# Patient Record
Sex: Female | Born: 1985
Health system: Southern US, Community
[De-identification: ages and names within clinical notes are randomized; demographics above are authoritative.]

## PROBLEM LIST (undated history)

## (undated) DIAGNOSIS — J45909 Unspecified asthma, uncomplicated: Secondary | ICD-10-CM

## (undated) DIAGNOSIS — F429 Obsessive-compulsive disorder, unspecified: Secondary | ICD-10-CM

## (undated) HISTORY — DX: Obsessive-compulsive disorder, unspecified: F42.9

---

## 2018-02-01 DIAGNOSIS — J069 Acute upper respiratory infection, unspecified: Secondary | ICD-10-CM | POA: Diagnosis not present

## 2018-02-01 DIAGNOSIS — H6981 Other specified disorders of Eustachian tube, right ear: Secondary | ICD-10-CM | POA: Diagnosis not present

## 2018-02-13 ENCOUNTER — Encounter: Payer: Self-pay | Admitting: Obstetrics and Gynecology

## 2018-02-22 ENCOUNTER — Encounter: Payer: Self-pay | Admitting: Obstetrics and Gynecology

## 2018-02-22 ENCOUNTER — Ambulatory Visit (INDEPENDENT_AMBULATORY_CARE_PROVIDER_SITE_OTHER): Payer: 59 | Admitting: Obstetrics and Gynecology

## 2018-02-22 DIAGNOSIS — Z01419 Encounter for gynecological examination (general) (routine) without abnormal findings: Secondary | ICD-10-CM

## 2018-02-22 DIAGNOSIS — Z124 Encounter for screening for malignant neoplasm of cervix: Secondary | ICD-10-CM

## 2018-02-22 DIAGNOSIS — N979 Female infertility, unspecified: Secondary | ICD-10-CM | POA: Diagnosis not present

## 2018-02-22 NOTE — Progress Notes (Signed)
Gynecology Annual Exam   PCP: Patient, No Pcp Per  Chief Complaint:  Chief Complaint  Patient presents with  . Establish Care  . Infertility    History of Present Illness: Patient is a 32 y.o. G0P0000 presents for annual exam. The patient has no complaints today.   LMP: Patient's last menstrual period was 02/09/2018. Average Interval: regular monthly, every 25 days day Duration of flow:5 days Heavy Menses: no Clots: no Intermenstrual Bleeding: no Postcoital Bleeding: no Dysmenorrhea: no  The patient is sexually active. She currently uses none for contraception. She denies dyspareunia.  The patient does perform self breast exams.  There is no notable family history of breast or ovarian cancer in her family.  The patient wears seatbelts: yes.   The patient has regular exercise: not asked.    The patient denies current symptoms of depression.     Patient is a 32 y.o. G0P0000 presenting for evaluation of infertility. Patient and partner have been attempting unprotected intercourse for 11 years and actively trying for conception for past year.  Pregnancies with current partner   She did not conceive with her prior husband, would now be interested in conceiving with her fiance.    Sexual History Dyspareunia: no Use of Lubricant: no Douching: not applicable  Ovulatory Evaluation LMP: Patient's last menstrual period was 02/09/2018. Menarche:13 Interval regular 25  days Duration of flow: 5 days Heavy Menses: no Clots: no Intermenstrual Bleeding: no Dysmenorrhea: no  Molimina yes Ovulation Predictor Kits Positive  yes  PCOS  Wt Change: no Hirsutism: no Acne: no  Hyperprolactinemia Galactorrhea: no Headaches: no Vision Changes:  no  Thyroid Temperature Intolerance: no Constipation or Diarrhea: no Hair Thinning:  no Palpitation:  no  Prior treatments Meds: Work up at [facility], was noted to be ovulatory but diagnosed with short luteal phase and  given progesterone supplementations.  Work up was 5 years ago Other Therapies: Progesterone  Premature Ovarian Failure Family history of autism, mental retardation, or fragile X: no Family history of premature ovarian failure or early menopause: no Prior radiation or chemotherapy exposoure:  no  Tubal Factor Previous abdominal or pelvic surgery: no Pelvic Pain:  no Endometriosis: no STD: no PID: no Laparoscopy: no Prior HSG: no  Female Factor Sired prior conception:  Yes, has 31 year old from prior relationship Semen analysis: no  Review of Systems: Review of Systems  Constitutional: Negative for chills and fever.  HENT: Negative for congestion.   Respiratory: Negative for cough and shortness of breath.   Cardiovascular: Negative for chest pain and palpitations.  Gastrointestinal: Negative for abdominal pain, constipation, diarrhea, heartburn, nausea and vomiting.  Genitourinary: Negative for dysuria, frequency and urgency.  Skin: Negative for itching and rash.  Neurological: Negative for dizziness and headaches.  Endo/Heme/Allergies: Negative for polydipsia.  Psychiatric/Behavioral: Negative for depression.    Past Medical History:  Past Medical History:  Diagnosis Date  . OCD (obsessive compulsive disorder)     Past Surgical History:  History reviewed. No pertinent surgical history.  Gynecologic History:  Patient's last menstrual period was 02/09/2018. Contraception: none Last Pap: Results were: 1 year ago reportedly normal   Obstetric History: G0P0000  Family History:  History reviewed. No pertinent family history.  Social History:  Social History   Socioeconomic History  . Marital status: Single    Spouse name: Not on file  . Number of children: Not on file  . Years of education: Not on file  . Highest education  level: Not on file  Social Needs  . Financial resource strain: Not on file  . Food insecurity - worry: Not on file  . Food insecurity -  inability: Not on file  . Transportation needs - medical: Not on file  . Transportation needs - non-medical: Not on file  Occupational History  . Not on file  Tobacco Use  . Smoking status: Never Smoker  . Smokeless tobacco: Never Used  Substance and Sexual Activity  . Alcohol use: Yes  . Drug use: No  . Sexual activity: Yes    Partners: Male    Birth control/protection: None  Other Topics Concern  . Not on file  Social History Narrative  . Not on file    Allergies:  Allergies  Allergen Reactions  . Sulfa Antibiotics Itching    Medications: Prior to Admission medications   Medication Sig Start Date End Date Taking? Authorizing Provider  fluvoxaMINE (LUVOX) 100 MG tablet Take 100 mg by mouth at bedtime.   Yes [provider]    Physical Exam Vitals: Blood pressure 108/60, pulse 91, height 5\' 8"  (1.727 m), weight 164 lb (74.4 kg), last menstrual period 02/09/2018.  General: NAD HEENT: normocephalic, anicteric Thyroid: no enlargement, no palpable nodules Pulmonary: No increased work of breathing, CTAB Cardiovascular: RRR, distal pulses 2+ Breast: Breast symmetrical, no tenderness, no palpable nodules or masses, no skin or nipple retraction present, no nipple discharge.  No axillary or supraclavicular lymphadenopathy. Abdomen: NABS, soft, non-tender, non-distended.  Umbilicus without lesions.  No hepatomegaly, splenomegaly or masses palpable. No evidence of hernia  Genitourinary:  External: Normal external female genitalia.  Normal urethral meatus, normal Bartholin's and Skene's glands.    Vagina: Normal vaginal mucosa, no evidence of prolapse.    Cervix: Grossly normal in appearance, no bleeding  Uterus: Non-enlarged, mobile, normal contour.  No CMT  Adnexa: ovaries non-enlarged, no adnexal masses  Rectal: deferred  Lymphatic: no evidence of inguinal lymphadenopathy Extremities: no edema, erythema, or tenderness Neurologic: Grossly intact Psychiatric: mood  appropriate, affect full  Female chaperone present for pelvic and breast  portions of the physical exam    Assessment: 32 y.o. G0P0000 routine annual exam  Plan: Problem List Items Addressed This Visit    None    Visit Diagnoses    Screening for malignant neoplasm of cervix       Relevant Orders   PapIG, HPV, rfx 16/18   Encounter for gynecological examination without abnormal finding       Relevant Orders   PapIG, HPV, rfx 16/18   Female infertility          2) STI screening  was notoffered and therefore not obtained  2)  ASCCP guidelines and rational discussed.  Patient opts for every 3 years screening interval  3) Contraception - the patient is currently using  none.  She is attempting to conceive in the near future  4) Routine healthcare maintenance including cholesterol, diabetes screening discussed managed by PCP  5) Infertility - will obtain records from [facility] to evaluate prior work up.  We can assume viable semen given prior conception by her fiance.  Will plan on HSG with next cycle to determine tubal patency.  We discussed the contentiousness of luteal phase defect in the medical literature, pros and cons of progestin support during the luteal phase of the cycle or in early pregnancy.  5) Return in 1 year (on 02/23/2019) for ANNUAL ([facility]).   Vena Austria, MD, Evern Core  [facility], [facility] 02/24/2018, 8:37 AM

## 2018-02-25 LAB — PAPIG, HPV, RFX 16/18
HPV, HIGH-RISK: NEGATIVE
PAP Smear Comment: 0

## 2018-03-05 ENCOUNTER — Telehealth: Payer: Self-pay

## 2018-03-05 ENCOUNTER — Other Ambulatory Visit: Payer: Self-pay | Admitting: Obstetrics and Gynecology

## 2018-03-05 DIAGNOSIS — N979 Female infertility, unspecified: Secondary | ICD-10-CM

## 2018-03-05 NOTE — Telephone Encounter (Signed)
Please advise 

## 2018-03-05 NOTE — Progress Notes (Signed)
LMP 03/05/18 schedule HSG for 4/4/1/8 day 10

## 2018-03-05 NOTE — Telephone Encounter (Signed)
Patient is aware of HSG on 03/15/18 @ 1:30pm. Patient is aware to arrive at the [facility] registration desk by 1:15pm. Directions given.

## 2018-03-05 NOTE — Telephone Encounter (Signed)
Pt states today is 1st day of her period.  AMS to schedule HSG test for her.  She will be out of town April 11-17th.  [phone]

## 2018-03-05 NOTE — Telephone Encounter (Signed)
-----   Message from Vena AustriaAndreas Staebler, MD sent at 03/05/2018  1:30 PM EDT ----- Regarding: HSG Needs HSG on 03/15/18

## 2018-03-06 ENCOUNTER — Telehealth: Payer: Self-pay

## 2018-03-06 NOTE — Telephone Encounter (Signed)
Pt states she is on her cycle and has developed a yeast infection and does not feel like OTC treatment would work well. Pt is requesting rx for diflucan sent to [facility]. Please advise. Thank you.  Cb# [phone].

## 2018-03-07 NOTE — Telephone Encounter (Signed)
Patient is calling to follow up on her medication request please advise

## 2018-03-08 ENCOUNTER — Other Ambulatory Visit: Payer: Self-pay | Admitting: Obstetrics and Gynecology

## 2018-03-08 MED ORDER — FLUCONAZOLE 150 MG PO TABS: 150.0000 mg | ORAL_TABLET | Freq: Once | ORAL | 0 refills | Status: AC

## 2018-03-08 NOTE — Telephone Encounter (Signed)
Rx called in tell her I was out of the office so just go the message today

## 2018-03-08 NOTE — Telephone Encounter (Signed)
Patient aware of prescription at pharmacy

## 2018-03-15 ENCOUNTER — Ambulatory Visit
Admission: RE | Admit: 2018-03-15 | Discharge: 2018-03-15 | Disposition: A | Discharge status: Home / Self Care | Payer: 59 | Source: Ambulatory Visit | Attending: Obstetrics and Gynecology | Admitting: Obstetrics and Gynecology

## 2018-03-15 DIAGNOSIS — N979 Female infertility, unspecified: Secondary | ICD-10-CM | POA: Insufficient documentation

## 2018-03-15 MED ORDER — IOPAMIDOL (ISOVUE-370) INJECTION 76%
50.0000 mL | Freq: Once | INTRAVENOUS | Status: AC | PRN
  Administered 2018-03-15: 50 mL

## 2018-03-15 NOTE — Procedures (Signed)
Consents signed and reviewed with patient.  Indication for procedure confirmed, Patient's last menstrual period was 03/05/2018 (exact date). currently cycle day 10.  No allergies to contrast or iodine was confirmed.  The catheter was primed with [id number] to eliminate air bubbles.  A sterile speculum was placed in the patient's vagina to allow visualization of the cervix.  The cervix was prepped with betadine solution.   The HSG was advanced through the external cervical os into uterine cavity, the catheter balloon was then inflated. Approximately 50mL dye injected under fluoroscopic, there was a poor cervical seal with reflux of dye vaginally.  Fallopian tube on the right showed free spill, the fallopian tube on the left partially filled but did not spill.  There was no evidence of hydrosalpinx and lack of spill is likely attributed to poor seal and or tubal spasm.  The catheter was placed on additional traction but this resulted in expulsion of the catheter.   Patient tolerated procedure well.   Vena AustriaAndreas Brenya Taulbee, MD, Evern CoreFACOG [facility], [facility] 03/15/2018, 2:36 PM

## 2018-03-28 ENCOUNTER — Other Ambulatory Visit: Payer: Self-pay | Admitting: Obstetrics and Gynecology

## 2018-03-28 MED ORDER — LETROZOLE 2.5 MG PO TABS: 2.5000 mg | ORAL_TABLET | Freq: Every day | ORAL | 0 refills | Status: DC

## 2018-04-12 ENCOUNTER — Ambulatory Visit (INDEPENDENT_AMBULATORY_CARE_PROVIDER_SITE_OTHER): Payer: 59 | Admitting: Physician Assistant

## 2018-04-12 ENCOUNTER — Encounter: Payer: Self-pay | Admitting: Physician Assistant

## 2018-04-12 DIAGNOSIS — F429 Obsessive-compulsive disorder, unspecified: Secondary | ICD-10-CM

## 2018-04-12 DIAGNOSIS — F419 Anxiety disorder, unspecified: Secondary | ICD-10-CM | POA: Diagnosis not present

## 2018-04-12 DIAGNOSIS — J309 Allergic rhinitis, unspecified: Secondary | ICD-10-CM | POA: Diagnosis not present

## 2018-04-12 MED ORDER — FLUVOXAMINE MALEATE 100 MG PO TABS: 100.0000 mg | ORAL_TABLET | Freq: Every day | ORAL | 0 refills | Status: DC

## 2018-04-12 NOTE — Progress Notes (Signed)
Patient: Joy Garcia Female    DOB: Jan 03, 1986   32 y.o.   MRN: [id number] Visit Date: 04/12/2018  Today's Provider: [name], PA-C   Chief Complaint  Patient presents with  . New Patient (Initial Visit)   Subjective:    HPI Patient comes in today to establish care into the practice. She moved in December from [place], [place] and is living in [place], [place] with her significant other, [name]. He has a two year old daughter from previous relationship. She feels well today. She works as a Patent examiner for [employer].   She reports she used to weigh 210 lbs and lost weigh via weight lifting and boot camp. She had labs recently drawn by previous PCP and says they were normal.  She has a history of OCD, currently table on 100 mg daily fluvoxamine. She reports long history of multiple other antidepressants/anxiety. She was seeing counselor previously, no longer, feels she does not need it at this time. She is currently taking Clonazepam 0.5mg  daily as needed for her anxiety. She reports that is varies on how often she has to take it. Reports it is rarely, for instances such as plane rides.  She is currently seeing Dr. Bonney Aid at [facility] for fertility treatment, unknown cause of fertility. She does not have any children. Sexually active with single female partner. PAP 02/2018 normal and HPV negative.      Allergies  Allergen Reactions  . Wellbutrin [Bupropion] Anaphylaxis  . Amoxicillin Itching  . Bactrim [Sulfamethoxazole-Trimethoprim]   . Sulfa Antibiotics Itching     Current Outpatient Medications:  .  clonazePAM (KLONOPIN) 0.5 MG tablet, Take 0.5 mg by mouth as needed for anxiety., Disp: , Rfl:  .  fluvoxaMINE (LUVOX) 100 MG tablet, Take 100 mg by mouth at bedtime., Disp: , Rfl:   Review of Systems  Constitutional: Negative.   HENT: Negative.   Eyes: Negative.   Respiratory: Negative.   Cardiovascular: Negative.   Gastrointestinal: Negative.     Endocrine: Negative.   Genitourinary: Negative.   Musculoskeletal: Negative.   Skin: Negative.   Allergic/Immunologic: Positive for environmental allergies.  Neurological: Negative.   Hematological: Negative.   Psychiatric/Behavioral: Negative.    No past surgical history on file.  Family History  Problem Relation Age of Onset  . Hyperlipidemia Mother   . Hypertension Mother   . Depression Mother   . Cancer Father        skin cancer  . Atrial fibrillation Father   . Hyperlipidemia Father   . Hypertension Father   . Heart disease Father   . Stroke Father     Social History   Tobacco Use  . Smoking status: Never Smoker  . Smokeless tobacco: Never Used  Substance Use Topics  . Alcohol use: Yes   Objective:   BP 118/72 (BP Location: Left Arm, Patient Position: Sitting, Cuff Size: Normal)   Pulse 74   Temp 98.9 F (37.2 C)   Resp 16   Ht  (1.727 m)   Wt 160 lb (72.6 kg)   SpO2 98%   BMI 24.33 kg/m  Vitals:   04/12/18 1406  BP: 118/72  Pulse: 74  Resp: 16  Temp: 98.9 F (37.2 C)  SpO2: 98%  Weight: 160 lb (72.6 kg)  Height:  (1.727 m)     Physical Exam  Constitutional: She is oriented to person, place, and time. She appears well-developed and well-nourished.  HENT:  Head:  Normocephalic and atraumatic.  Right Ear: External ear normal.  Left Ear: External ear normal.  Mouth/Throat: Oropharynx is clear and moist.  Eyes: Conjunctivae are normal. Right eye exhibits no discharge. Left eye exhibits no discharge.  Neck: Neck supple.  Cardiovascular: Normal rate and regular rhythm.  Pulmonary/Chest: Effort normal and breath sounds normal.  Lymphadenopathy:    She has no cervical adenopathy.  Neurological: She is alert and oriented to person, place, and time.  Skin: Skin is warm and dry.  Psychiatric: She has a normal mood and affect. Her behavior is normal.        Assessment & Plan:     1. Anxiety  Refilled, stable on current  medication.  - fluvoxaMINE (LUVOX) 100 MG tablet; Take 1 tablet (100 mg total) by mouth at bedtime.  Dispense: 90 tablet; Refill: 0  2. Allergic rhinitis, unspecified seasonality, unspecified trigger   3. Obsessive-compulsive disorder, unspecified type  - fluvoxaMINE (LUVOX) 100 MG tablet; Take 1 tablet (100 mg total) by mouth at bedtime.  Dispense: 90 tablet; Refill: 0  The entirety of the information documented in the History of Present Illness, Review of Systems and Physical Exam were personally obtained by me. Portions of this information were initially documented by Anson Oregon, CMA and reviewed by me for thoroughness and accuracy.   Return in about 1 year (around 04/13/2019) for CPE/Follow up .        [name], PA-C  [facility]

## 2018-04-12 NOTE — Patient Instructions (Signed)

## 2018-04-19 ENCOUNTER — Telehealth: Payer: Self-pay | Admitting: Obstetrics and Gynecology

## 2018-04-19 ENCOUNTER — Other Ambulatory Visit: Payer: Self-pay | Admitting: Obstetrics and Gynecology

## 2018-04-19 ENCOUNTER — Other Ambulatory Visit: Payer: 59

## 2018-04-19 DIAGNOSIS — N979 Female infertility, unspecified: Secondary | ICD-10-CM

## 2018-04-19 NOTE — Telephone Encounter (Signed)
Patient is schedule 04/19/18 for labs

## 2018-04-19 NOTE — Telephone Encounter (Signed)
-----   Message from Vena Austria, MD sent at 04/19/2018  7:28 AM EDT ----- Regarding: Progesterone lab Needs progesterone today 04/19/18

## 2018-04-20 ENCOUNTER — Encounter (INDEPENDENT_AMBULATORY_CARE_PROVIDER_SITE_OTHER): Payer: Self-pay

## 2018-04-20 LAB — PROGESTERONE: Progesterone: 11.8 ng/mL

## 2018-04-26 ENCOUNTER — Other Ambulatory Visit: Payer: Self-pay | Admitting: Obstetrics and Gynecology

## 2018-04-26 MED ORDER — LETROZOLE 2.5 MG PO TABS: 2.5000 mg | ORAL_TABLET | Freq: Every day | ORAL | 0 refills | Status: AC

## 2018-04-26 NOTE — Progress Notes (Signed)
LMP 04/25/2018 Cycle II letrozole 2.5mg 

## 2018-05-18 ENCOUNTER — Other Ambulatory Visit: Payer: Self-pay | Admitting: Obstetrics and Gynecology

## 2018-05-18 MED ORDER — LETROZOLE 2.5 MG PO TABS: 2.5000 mg | ORAL_TABLET | Freq: Every day | ORAL | 0 refills | Status: AC

## 2018-05-18 NOTE — Progress Notes (Signed)
LMP 05/17/2018 Cycle III letrozole 2.5mg 

## 2018-07-05 ENCOUNTER — Other Ambulatory Visit: Payer: Self-pay | Admitting: Obstetrics and Gynecology

## 2018-07-05 MED ORDER — LETROZOLE 2.5 MG PO TABS: 2.5000 mg | ORAL_TABLET | Freq: Every day | ORAL | 0 refills | Status: AC

## 2018-07-05 NOTE — Progress Notes (Signed)
LMP 07/04/2018 Cycle IV letrozole 2.5mg 

## 2018-08-24 ENCOUNTER — Other Ambulatory Visit: Payer: Self-pay

## 2018-08-24 DIAGNOSIS — F419 Anxiety disorder, unspecified: Secondary | ICD-10-CM

## 2018-08-24 DIAGNOSIS — F429 Obsessive-compulsive disorder, unspecified: Secondary | ICD-10-CM

## 2018-08-24 MED ORDER — FLUVOXAMINE MALEATE 100 MG PO TABS: 100.0000 mg | ORAL_TABLET | Freq: Every day | ORAL | 1 refills | Status: DC

## 2019-01-24 ENCOUNTER — Ambulatory Visit: Payer: 59 | Admitting: Physician Assistant

## 2019-01-24 ENCOUNTER — Encounter: Payer: Self-pay | Admitting: Physician Assistant

## 2019-01-24 VITALS — BP 119/82 | HR 67 | Temp 98.7°F | Wt 185.4 lb

## 2019-01-24 DIAGNOSIS — B356 Tinea cruris: Secondary | ICD-10-CM | POA: Diagnosis not present

## 2019-01-24 DIAGNOSIS — L299 Pruritus, unspecified: Secondary | ICD-10-CM | POA: Diagnosis not present

## 2019-01-24 MED ORDER — HYDROXYZINE HCL 10 MG PO TABS: 10.0000 mg | ORAL_TABLET | Freq: Every evening | ORAL | 0 refills | Status: DC | PRN

## 2019-01-24 NOTE — Progress Notes (Signed)
       Patient: Joy Garcia Female    DOB: 1986/05/11   33 y.o.   MRN: [id number] Visit Date: 01/29/2019  Today's Provider: Trey SailorsAdriana M Pollak, PA-C   Chief Complaint  Patient presents with  . Rash   Subjective:    Rash  The current episode started 1 to 4 weeks ago. The problem is unchanged. The affected locations include the groin. The rash is characterized by itchiness. She was exposed to nothing. Past treatments include anti-itch cream. The treatment provided no relief.   She has been using lamisil cream OTC. She reports there was a bump in the area.    Allergies  Allergen Reactions  . Wellbutrin [Bupropion] Anaphylaxis  . Amoxicillin Itching  . Bactrim [Sulfamethoxazole-Trimethoprim]   . Sulfa Antibiotics Itching     Current Outpatient Medications:  .  clonazePAM (KLONOPIN) 0.5 MG tablet, Take 0.5 mg by mouth as needed for anxiety., Disp: , Rfl:  .  fluvoxaMINE (LUVOX) 100 MG tablet, Take 1 tablet (100 mg total) by mouth at bedtime., Disp: 90 tablet, Rfl: 1 .  hydrOXYzine (ATARAX/VISTARIL) 10 MG tablet, Take 1 tablet (10 mg total) by mouth at bedtime as needed., Disp: 30 tablet, Rfl: 0 .  Prenatal Vit-Fe Fumarate-FA (MULTIVITAMIN-PRENATAL) 27-0.8 MG TABS tablet, Take 1 tablet by mouth daily at 12 noon., Disp: , Rfl:   Review of Systems  HENT: Negative.   Respiratory: Negative.   Genitourinary: Negative.   Skin: Positive for rash.  Neurological: Negative.     Social History   Tobacco Use  . Smoking status: Never Smoker  . Smokeless tobacco: Never Used  Substance Use Topics  . Alcohol use: Yes      Objective:   BP 119/82 (BP Location: Left Arm, Patient Position: Sitting, Cuff Size: Normal)   Pulse 67   Temp 98.7 F (37.1 C) (Oral)   Wt 185 lb 6.4 oz (84.1 kg)   LMP 01/21/2019   SpO2 99%   BMI 28.19 kg/m  Vitals:   01/24/19 1205  BP: 119/82  Pulse: 67  Temp: 98.7 F (37.1 C)  TempSrc: Oral  SpO2: 99%  Weight: 185 lb 6.4 oz (84.1 kg)      Physical Exam Constitutional:      Appearance: Normal appearance.  Genitourinary:    Pubic Area: Rash present.    Skin:    General: Skin is warm and dry.  Neurological:     Mental Status: She is alert and oriented to person, place, and time. Mental status is at baseline.         Assessment & Plan    1. Tinea cruris  Hydroxyzine for itching. Continue lamisil for one week after disappearance of lesion.  - hydrOXYzine (ATARAX/VISTARIL) 10 MG tablet; Take 1 tablet (10 mg total) by mouth at bedtime as needed.  Dispense: 30 tablet; Refill: 0  2. Itching  - hydrOXYzine (ATARAX/VISTARIL) 10 MG tablet; Take 1 tablet (10 mg total) by mouth at bedtime as needed.  Dispense: 30 tablet; Refill: 0  The entirety of the information documented in the History of Present Illness, Review of Systems and Physical Exam were personally obtained by me. Portions of this information were initially documented by Rondel BatonSulibeya Dimas, CMA and reviewed by me for thoroughness and accuracy.   Return if symptoms worsen or fail to improve.     Trey SailorsAdriana M Pollak, PA-C  [facility]

## 2019-01-24 NOTE — Patient Instructions (Signed)

## 2019-03-01 ENCOUNTER — Other Ambulatory Visit: Payer: Self-pay | Admitting: Family Medicine

## 2019-03-01 DIAGNOSIS — F419 Anxiety disorder, unspecified: Secondary | ICD-10-CM

## 2019-03-01 DIAGNOSIS — F429 Obsessive-compulsive disorder, unspecified: Secondary | ICD-10-CM

## 2019-03-21 ENCOUNTER — Encounter: Payer: Self-pay | Admitting: Physician Assistant

## 2019-03-21 DIAGNOSIS — N898 Other specified noninflammatory disorders of vagina: Secondary | ICD-10-CM

## 2019-03-21 MED ORDER — NYSTATIN 100000 UNIT/GM EX POWD: Freq: Four times a day (QID) | CUTANEOUS | 0 refills | Status: DC

## 2019-04-16 DIAGNOSIS — Z630 Problems in relationship with spouse or partner: Secondary | ICD-10-CM | POA: Diagnosis not present

## 2019-09-10 ENCOUNTER — Ambulatory Visit: Payer: 59 | Admitting: Physician Assistant

## 2019-09-10 ENCOUNTER — Other Ambulatory Visit: Payer: Self-pay

## 2019-09-10 ENCOUNTER — Encounter: Payer: Self-pay | Admitting: Physician Assistant

## 2019-09-10 VITALS — BP 122/83 | HR 68 | Temp 97.3°F | Wt 200.0 lb

## 2019-09-10 DIAGNOSIS — E669 Obesity, unspecified: Secondary | ICD-10-CM

## 2019-09-10 DIAGNOSIS — Z9109 Other allergy status, other than to drugs and biological substances: Secondary | ICD-10-CM | POA: Diagnosis not present

## 2019-09-10 DIAGNOSIS — N920 Excessive and frequent menstruation with regular cycle: Secondary | ICD-10-CM | POA: Diagnosis not present

## 2019-09-10 DIAGNOSIS — G4700 Insomnia, unspecified: Secondary | ICD-10-CM | POA: Diagnosis not present

## 2019-09-10 MED ORDER — TRAZODONE HCL 50 MG PO TABS: 50.0000 mg | ORAL_TABLET | Freq: Every day | ORAL | 0 refills | Status: DC

## 2019-09-10 MED ORDER — NORETHINDRONE 0.35 MG PO TABS: 1.0000 | ORAL_TABLET | Freq: Every day | ORAL | 3 refills | Status: DC

## 2019-09-10 NOTE — Progress Notes (Signed)
Patient: Joy Garcia Female    DOB: July 27, 1986   33 y.o.   MRN: [id number] Visit Date: 09/10/2019  Today's Provider: [name], PA-C   Chief Complaint  Patient presents with  . Allergies    Pt would like allergy testing  . Contraception  . Insomnia   Subjective:    Allergy Testing: mother got allergy testing and had a lot of allergies. Taking xyzal.   Birth control: history OCP, yasmin. Not currently on birth control, menorrhagia. No vaginal ring, has done patches. Did not tolerate IUD. No history of MI, blood clot, or stroke.  Patient urinates 4-5 times per night. Adenoids removed when was young. Doesn't nap du. trazadone knocks her out.   Trazadone  Gave some hungover feeling at 25-50 mg nightly.   Insomnia Primary symptoms: sleep disturbance, difficulty falling asleep, frequent awakening, malaise/fatigue.  Episode onset: Started over several years ago. The problem occurs nightly. The problem is unchanged. How many beverages per day that contain caffeine: 0 - 1.  Types of beverages you drink: soda. Past treatments include medication (Pt has tried trazodone). Typical bedtime:  10-11 P.M..  How long after going to bed to you fall asleep: over an hour.     Weight gain: has had a lot of dental issues including needing full dentures on bottom teeth and having to be on puree diet due to this. Noticed weight gain during this time. Is exercising, focusing on calorie reduction. Would like something to jump start weight loss.   BMI Readings from Last 5 Encounters:  09/10/19 30.41 kg/m  01/24/19 28.19 kg/m  04/12/18 24.33 kg/m  02/22/18 24.94 kg/m   Wt Readings from Last 3 Encounters:  09/10/19 200 lb (90.7 kg)  01/24/19 185 lb 6.4 oz (84.1 kg)  04/12/18 160 lb (72.6 kg)     Allergies  Allergen Reactions  . Wellbutrin [Bupropion] Anaphylaxis  . Amoxicillin Itching  . Bactrim [Sulfamethoxazole-Trimethoprim]   . Sulfa Antibiotics Itching     Current  Outpatient Medications:  .  clonazePAM (KLONOPIN) 0.5 MG tablet, Take 0.5 mg by mouth as needed for anxiety., Disp: , Rfl:  .  fluvoxaMINE (LUVOX) 100 MG tablet, TAKE ONE TABLET BY MOUTH EVERY NIGHT AT BEDTIME, Disp: 90 tablet, Rfl: 1 .  hydrOXYzine (ATARAX/VISTARIL) 10 MG tablet, Take 1 tablet (10 mg total) by mouth at bedtime as needed., Disp: 30 tablet, Rfl: 0 .  nystatin (MYCOSTATIN/NYSTOP) powder, Apply topically 4 (four) times daily. (Patient not taking: Reported on 09/10/2019), Disp: 15 g, Rfl: 0 .  Prenatal Vit-Fe Fumarate-FA (MULTIVITAMIN-PRENATAL) 27-0.8 MG TABS tablet, Take 1 tablet by mouth daily at 12 noon., Disp: , Rfl:   Review of Systems  Constitutional: Positive for fatigue and malaise/fatigue. Negative for activity change, appetite change, chills, diaphoresis, fever and unexpected weight change.  HENT: Positive for congestion, postnasal drip, sneezing and sore throat. Negative for sinus pressure, sinus pain, trouble swallowing and voice change.   Eyes: Negative.   Respiratory: Negative.   Cardiovascular: Negative.   Gastrointestinal: Negative.   Allergic/Immunologic: Positive for environmental allergies.  Psychiatric/Behavioral: Positive for sleep disturbance. Negative for agitation, behavioral problems, confusion, decreased concentration, dysphoric mood, hallucinations, self-injury and suicidal ideas. The patient is nervous/anxious and has insomnia. The patient is not hyperactive.     Social History   Tobacco Use  . Smoking status: Never Smoker  . Smokeless tobacco: Never Used  Substance Use Topics  . Alcohol use: Yes      Objective:  BP 122/83 (BP Location: Left Arm, Patient Position: Sitting, Cuff Size: Large)   Pulse 68   Temp (!) 97.3 F (36.3 C) (Temporal)   Wt 200 lb (90.7 kg)   BMI 30.41 kg/m  Vitals:   09/10/19 1535  BP: 122/83  Pulse: 68  Temp: (!) 97.3 F (36.3 C)  TempSrc: Temporal  Weight: 200 lb (90.7 kg)  Body mass index is 30.41 kg/m.    Physical Exam Constitutional:      Appearance: Normal appearance.  Cardiovascular:     Rate and Rhythm: Normal rate and regular rhythm.     Heart sounds: Normal heart sounds.  Pulmonary:     Effort: Pulmonary effort is normal.     Breath sounds: Normal breath sounds.  Skin:    General: Skin is warm and dry.  Neurological:     Mental Status: She is alert and oriented to person, place, and time. Mental status is at baseline.  Psychiatric:        Mood and Affect: Mood normal.        Behavior: Behavior normal.      No results found for any visits on 09/10/19.     Assessment & Plan    1. Environmental allergies  - Ambulatory referral to ENT  2. Menorrhagia with regular cycle  - norethindrone (CAMILA) 0.35 MG tablet; Take 1 tablet (0.35 mg total) by mouth daily.  Dispense: 3 Package; Refill: 3  3. Insomnia, unspecified type  Try low dose trazodone. If not successful, consider belsomra and possibly sleep referral.  - traZODone (DESYREL) 50 MG tablet; Take 1 tablet (50 mg total) by mouth at bedtime.  Dispense: 30 tablet; Refill: 0  4. Class 1 obesity without serious comorbidity in adult, unspecified BMI, unspecified obesity type  Will get baseline. Counseled phentermine prescribing practices - 3 months only for use in conjunction with diet and exercise. Will prescribe 37.5 mg daily with one month follow up pending normal labwork.   - Comprehensive Metabolic Panel (CMET) - TSH - CBC with Differential - Lipid Profile  The entirety of the information documented in the History of Present Illness, Review of Systems and Physical Exam were personally obtained by me. Portions of this information were initially documented by Lynford Humphrey, CMA and reviewed by me for thoroughness and accuracy.        Trinna Post, PA-C  [facility]

## 2019-09-11 NOTE — Patient Instructions (Signed)

## 2019-09-12 LAB — COMPREHENSIVE METABOLIC PANEL
ALT: 14 IU/L (ref 0–32)
AST: 18 IU/L (ref 0–40)
Albumin/Globulin Ratio: 1.5 (ref 1.2–2.2)
Albumin: 4.2 g/dL (ref 3.8–4.8)
Alkaline Phosphatase: 92 IU/L (ref 39–117)
BUN/Creatinine Ratio: 28 — ABNORMAL HIGH (ref 9–23)
BUN: 19 mg/dL (ref 6–20)
Bilirubin Total: 0.3 mg/dL (ref 0.0–1.2)
CO2: 20 mmol/L (ref 20–29)
Calcium: 9.5 mg/dL (ref 8.7–10.2)
Chloride: 107 mmol/L — ABNORMAL HIGH (ref 96–106)
Creatinine, Ser: 0.68 mg/dL (ref 0.57–1.00)
GFR calc Af Amer: 134 mL/min/{1.73_m2} (ref >59)
GFR calc non Af Amer: 116 mL/min/{1.73_m2} (ref >59)
Globulin, Total: 2.8 g/dL (ref 1.5–4.5)
Glucose: 95 mg/dL (ref 65–99)
Potassium: 4.1 mmol/L (ref 3.5–5.2)
Sodium: 140 mmol/L (ref 134–144)
Total Protein: 7 g/dL (ref 6.0–8.5)

## 2019-09-12 LAB — CBC WITH DIFFERENTIAL/PLATELET
Basophils Absolute: 0 10*3/uL (ref 0.0–0.2)
Basos: 1 %
EOS (ABSOLUTE): 0.3 10*3/uL (ref 0.0–0.4)
Eos: 4 %
Hematocrit: 42.3 % (ref 34.0–46.6)
Hemoglobin: 13.9 g/dL (ref 11.1–15.9)
Immature Grans (Abs): 0 10*3/uL (ref 0.0–0.1)
Immature Granulocytes: 0 %
Lymphocytes Absolute: 1.8 10*3/uL (ref 0.7–3.1)
Lymphs: 29 %
MCH: 28.1 pg (ref 26.6–33.0)
MCHC: 32.9 g/dL (ref 31.5–35.7)
MCV: 86 fL (ref 79–97)
Monocytes Absolute: 0.5 10*3/uL (ref 0.1–0.9)
Monocytes: 8 %
Neutrophils Absolute: 3.7 10*3/uL (ref 1.4–7.0)
Neutrophils: 58 %
Platelets: 220 10*3/uL (ref 150–450)
RBC: 4.95 x10E6/uL (ref 3.77–5.28)
RDW: 13 % (ref 11.7–15.4)
WBC: 6.4 10*3/uL (ref 3.4–10.8)

## 2019-09-12 LAB — LIPID PANEL
Chol/HDL Ratio: 3.4 ratio (ref 0.0–4.4)
Cholesterol, Total: 175 mg/dL (ref 100–199)
HDL: 52 mg/dL (ref >39)
LDL Chol Calc (NIH): 112 mg/dL — ABNORMAL HIGH (ref 0–99)
Triglycerides: 54 mg/dL (ref 0–149)
VLDL Cholesterol Cal: 11 mg/dL (ref 5–40)

## 2019-09-12 LAB — TSH: TSH: 0.794 u[IU]/mL (ref 0.450–4.500)

## 2019-09-13 ENCOUNTER — Telehealth: Payer: Self-pay | Admitting: Physician Assistant

## 2019-09-13 MED ORDER — PHENTERMINE HCL 37.5 MG PO CAPS: 37.5000 mg | ORAL_CAPSULE | ORAL | 0 refills | Status: DC

## 2019-09-13 NOTE — Addendum Note (Signed)
Addended by: Trinna Post on: 09/13/2019 09:04 AM   Modules accepted: Orders

## 2019-10-10 ENCOUNTER — Telehealth: Payer: Self-pay | Admitting: Physician Assistant

## 2019-10-10 ENCOUNTER — Telehealth (INDEPENDENT_AMBULATORY_CARE_PROVIDER_SITE_OTHER): Payer: 59 | Admitting: Physician Assistant

## 2019-10-10 DIAGNOSIS — R635 Abnormal weight gain: Secondary | ICD-10-CM

## 2019-10-10 DIAGNOSIS — G4700 Insomnia, unspecified: Secondary | ICD-10-CM | POA: Diagnosis not present

## 2019-10-10 MED ORDER — TRAZODONE HCL 50 MG PO TABS: 25.0000 mg | ORAL_TABLET | Freq: Every evening | ORAL | 5 refills | Status: DC | PRN

## 2019-10-10 MED ORDER — PHENTERMINE HCL 37.5 MG PO CAPS: 37.5000 mg | ORAL_CAPSULE | ORAL | 1 refills | Status: DC

## 2019-10-10 NOTE — Progress Notes (Signed)
Subjective:    Patient ID: Joy Garcia, female    DOB: [birth year], 33 y.o.   MRN: [id number]  Joy Garcia is a 33 y.o. female presenting on 10/10/2019 for Weight Gain  Virtual Visit via Video Note  I connected with Joy Garcia on 10/10/19 at  3:20 PM EDT by a video enabled telemedicine application and verified that I am speaking with the correct person using two identifiers.   I discussed the limitations of evaluation and management by telemedicine and the availability of in person appointments. The patient expressed understanding and agreed to proceed.  Patient location: home Provider location: [place] office Persons involved in the visit: patient, provider   Interactive audio and video communications were attempted, although failed due to patient's inability to connect to video. Continued visit with audio only interaction with patient agreement.   HPI   Reports she has started on phentermine 37.5 mg daily. She is back in the gym 3-4 times per week. She reports she is currently eating healthier and her current weight today is 185 pounds, down 15 lbs from last time. She denies palpitations, chest pain, or insomnia.   She reports she started trazadone last visit for insomnia and this is helping her sleep very well. She is taking 25 mg nightly and would like to refill this.   Wt Readings from Last 3 Encounters:  09/10/19 200 lb (90.7 kg)  01/24/19 185 lb 6.4 oz (84.1 kg)  04/12/18 160 lb (72.6 kg)     Social History   Tobacco Use  . Smoking status: Never Smoker  . Smokeless tobacco: Never Used  Substance Use Topics  . Alcohol use: Yes  . Drug use: No    Review of Systems Per HPI unless specifically indicated above     Objective:    There were no vitals taken for this visit.  Wt Readings from Last 3 Encounters:  09/10/19 200 lb (90.7 kg)  01/24/19 185 lb 6.4 oz (84.1 kg)  04/12/18 160 lb (72.6 kg)    Physical Exam Results  for orders placed or performed in visit on 09/10/19  Comprehensive Metabolic Panel (CMET)  Result Value Ref Range   Glucose 95 65 - 99 mg/dL   BUN 19 6 - 20 mg/dL   Creatinine, Ser 0.68 0.57 - 1.00 mg/dL   GFR calc non Af Amer 116 >59 mL/min/1.73   GFR calc Af Amer 134 >59 mL/min/1.73   BUN/Creatinine Ratio 28 (H) 9 - 23   Sodium 140 134 - 144 mmol/L   Potassium 4.1 3.5 - 5.2 mmol/L   Chloride 107 (H) 96 - 106 mmol/L   CO2 20 20 - 29 mmol/L   Calcium 9.5 8.7 - 10.2 mg/dL   Total Protein 7.0 6.0 - 8.5 g/dL   Albumin 4.2 3.8 - 4.8 g/dL   Globulin, Total 2.8 1.5 - 4.5 g/dL   Albumin/Globulin Ratio 1.5 1.2 - 2.2   Bilirubin Total 0.3 0.0 - 1.2 mg/dL   Alkaline Phosphatase 92 39 - 117 IU/L   AST 18 0 - 40 IU/L   ALT 14 0 - 32 IU/L  TSH  Result Value Ref Range   TSH 0.794 0.450 - 4.500 uIU/mL  CBC with Differential  Result Value Ref Range   WBC 6.4 3.4 - 10.8 x10E3/uL   RBC 4.95 3.77 - 5.28 x10E6/uL   Hemoglobin 13.9 11.1 - 15.9 g/dL   Hematocrit 42.3 34.0 - 46.6 %   MCV 86 79 - 97 fL  MCH 28.1 26.6 - 33.0 pg   MCHC 32.9 31.5 - 35.7 g/dL   RDW 06.2 69.4 - 85.4 %   Platelets 220 150 - 450 x10E3/uL   Neutrophils 58 Not Estab. %   Lymphs 29 Not Estab. %   Monocytes 8 Not Estab. %   Eos 4 Not Estab. %   Basos 1 Not Estab. %   Neutrophils Absolute 3.7 1.4 - 7.0 x10E3/uL   Lymphocytes Absolute 1.8 0.7 - 3.1 x10E3/uL   Monocytes Absolute 0.5 0.1 - 0.9 x10E3/uL   EOS (ABSOLUTE) 0.3 0.0 - 0.4 x10E3/uL   Basophils Absolute 0.0 0.0 - 0.2 x10E3/uL   Immature Granulocytes 0 Not Estab. %   Immature Grans (Abs) 0.0 0.0 - 0.1 x10E3/uL  Lipid Profile  Result Value Ref Range   Cholesterol, Total 175 100 - 199 mg/dL   Triglycerides 54 0 - 149 mg/dL   HDL 52 >62 mg/dL   VLDL Cholesterol Cal 11 5 - 40 mg/dL   LDL Chol Calc (NIH) 703 (H) 0 - 99 mg/dL   Chol/HDL Ratio 3.4 0.0 - 4.4 ratio      Assessment & Plan:  1. Weight gain  We will continue phentermine for 2 more months.   -  phentermine 37.5 MG capsule; Take 1 capsule (37.5 mg total) by mouth every morning.  Dispense: 30 capsule; Refill: 1  2. Insomnia, unspecified type  Refilled as she is getting good relief from this.   - traZODone (DESYREL) 50 MG tablet; Take 0.5-1 tablets (25-50 mg total) by mouth at bedtime as needed for sleep.  Dispense: 30 tablet; Refill: 5    Follow up plan: Return if symptoms worsen or fail to improve.   Osvaldo Angst, PA-C [facility] 10/10/2019, 3:46 PM

## 2019-10-10 NOTE — Telephone Encounter (Signed)
Called at 3:23 PM to start 3:20 visit but did not receive answer. Called again and left voicemail.

## 2019-10-10 NOTE — Patient Instructions (Signed)
° °Calorie Counting for Weight Loss °Calories are units of energy. Your body needs a certain amount of calories from food to keep you going throughout the day. When you eat more calories than your body needs, your body stores the extra calories as fat. When you eat fewer calories than your body needs, your body burns fat to get the energy it needs. °Calorie counting means keeping track of how many calories you eat and drink each day. Calorie counting can be helpful if you need to lose weight. If you make sure to eat fewer calories than your body needs, you should lose weight. Ask your health care provider what a healthy weight is for you. °For calorie counting to work, you will need to eat the right number of calories in a day in order to lose a healthy amount of weight per week. A dietitian can help you determine how many calories you need in a day and will give you suggestions on how to reach your calorie goal. °· A healthy amount of weight to lose per week is usually 1-2 lb (0.5-0.9 kg). This usually means that your daily calorie intake should be reduced by 500-750 calories. °· Eating 1,200 - 1,500 calories per day can help most women lose weight. °· Eating 1,500 - 1,800 calories per day can help most men lose weight. °What is my plan? °My goal is to have __________ calories per day. °If I have this many calories per day, I should lose around __________ pounds per week. °What do I need to know about calorie counting? °In order to meet your daily calorie goal, you will need to: °· Find out how many calories are in each food you would like to eat. Try to do this before you eat. °· Decide how much of the food you plan to eat. °· Write down what you ate and how many calories it had. Doing this is called keeping a food log. °To successfully lose weight, it is important to balance calorie counting with a healthy lifestyle that includes regular activity. Aim for 150 minutes of moderate exercise (such as walking) or 75  minutes of vigorous exercise (such as running) each week. °Where do I find calorie information? ° °The number of calories in a food can be found on a Nutrition Facts label. If a food does not have a Nutrition Facts label, try to look up the calories online or ask your dietitian for help. °Remember that calories are listed per serving. If you choose to have more than one serving of a food, you will have to multiply the calories per serving by the amount of servings you plan to eat. For example, the label on a package of bread might say that a serving size is 1 slice and that there are 90 calories in a serving. If you eat 1 slice, you will have eaten 90 calories. If you eat 2 slices, you will have eaten 180 calories. °How do I keep a food log? °Immediately after each meal, record the following information in your food log: °· What you ate. Don't forget to include toppings, sauces, and other extras on the food. °· How much you ate. This can be measured in cups, ounces, or number of items. °· How many calories each food and drink had. °· The total number of calories in the meal. °Keep your food log near you, such as in a small notebook in your pocket, or use a mobile app or website. Some programs will   calculate calories for you and show you how many calories you have left for the day to meet your goal. °What are some calorie counting tips? ° °· Use your calories on foods and drinks that will fill you up and not leave you hungry: °? Some examples of foods that fill you up are nuts and nut butters, vegetables, lean proteins, and high-fiber foods like whole grains. High-fiber foods are foods with more than 5 g fiber per serving. °? Drinks such as sodas, specialty coffee drinks, alcohol, and juices have a lot of calories, yet do not fill you up. °· Eat nutritious foods and avoid empty calories. Empty calories are calories you get from foods or beverages that do not have many vitamins or protein, such as candy, sweets, and  soda. It is better to have a nutritious high-calorie food (such as an avocado) than a food with few nutrients (such as a bag of chips). °· Know how many calories are in the foods you eat most often. This will help you calculate calorie counts faster. °· Pay attention to calories in drinks. Low-calorie drinks include water and unsweetened drinks. °· Pay attention to nutrition labels for "low fat" or "fat free" foods. These foods sometimes have the same amount of calories or more calories than the full fat versions. They also often have added sugar, starch, or salt, to make up for flavor that was removed with the fat. °· Find a way of tracking calories that works for you. Get creative. Try different apps or programs if writing down calories does not work for you. °What are some portion control tips? °· Know how many calories are in a serving. This will help you know how many servings of a certain food you can have. °· Use a measuring cup to measure serving sizes. You could also try weighing out portions on a kitchen scale. With time, you will be able to estimate serving sizes for some foods. °· Take some time to put servings of different foods on your favorite plates, bowls, and cups so you know what a serving looks like. °· Try not to eat straight from a bag or box. Doing this can lead to overeating. Put the amount you would like to eat in a cup or on a plate to make sure you are eating the right portion. °· Use smaller plates, glasses, and bowls to prevent overeating. °· Try not to multitask (for example, watch TV or use your computer) while eating. If it is time to eat, sit down at a table and enjoy your food. This will help you to know when you are full. It will also help you to be aware of what you are eating and how much you are eating. °What are tips for following this plan? °Reading food labels °· Check the calorie count compared to the serving size. The serving size may be smaller than what you are used to  eating. °· Check the source of the calories. Make sure the food you are eating is high in vitamins and protein and low in saturated and trans fats. °Shopping °· Read nutrition labels while you shop. This will help you make healthy decisions before you decide to purchase your food. °· Make a grocery list and stick to it. °Cooking °· Try to cook your favorite foods in a healthier way. For example, try baking instead of frying. °· Use low-fat dairy products. °Meal planning °· Use more fruits and vegetables. Half of your plate should be   fruits and vegetables. °· Include lean proteins like poultry and fish. °How do I count calories when eating out? °· Ask for smaller portion sizes. °· Consider sharing an entree and sides instead of getting your own entree. °· If you get your own entree, eat only half. Ask for a box at the beginning of your meal and put the rest of your entree in it so you are not tempted to eat it. °· If calories are listed on the menu, choose the lower calorie options. °· Choose dishes that include vegetables, fruits, whole grains, low-fat dairy products, and lean protein. °· Choose items that are boiled, broiled, grilled, or steamed. Stay away from items that are buttered, battered, fried, or served with cream sauce. Items labeled "crispy" are usually fried, unless stated otherwise. °· Choose water, low-fat milk, unsweetened iced tea, or other drinks without added sugar. If you want an alcoholic beverage, choose a lower calorie option such as a glass of wine or light beer. °· Ask for dressings, sauces, and syrups on the side. These are usually high in calories, so you should limit the amount you eat. °· If you want a salad, choose a garden salad and ask for grilled meats. Avoid extra toppings like bacon, cheese, or fried items. Ask for the dressing on the side, or ask for olive oil and vinegar or lemon to use as dressing. °· Estimate how many servings of a food you are given. For example, a serving of  cooked rice is ½ cup or about the size of half a baseball. Knowing serving sizes will help you be aware of how much food you are eating at restaurants. The list below tells you how big or small some common portion sizes are based on everyday objects: °? 1 oz--4 stacked dice. °? 3 oz--1 deck of cards. °? 1 tsp--1 die. °? 1 Tbsp--½ a ping-pong ball. °? 2 Tbsp--1 ping-pong ball. °? ½ cup--½ baseball. °? 1 cup--1 baseball. °Summary °· Calorie counting means keeping track of how many calories you eat and drink each day. If you eat fewer calories than your body needs, you should lose weight. °· A healthy amount of weight to lose per week is usually 1-2 lb (0.5-0.9 kg). This usually means reducing your daily calorie intake by 500-750 calories. °· The number of calories in a food can be found on a Nutrition Facts label. If a food does not have a Nutrition Facts label, try to look up the calories online or ask your dietitian for help. °· Use your calories on foods and drinks that will fill you up, and not on foods and drinks that will leave you hungry. °· Use smaller plates, glasses, and bowls to prevent overeating. °This information is not intended to replace advice given to you by your health care provider. Make sure you discuss any questions you have with your health care provider. °Document Released: 11/28/2005 Document Revised: 08/17/2018 Document Reviewed: 10/28/2016 °Elsevier Patient Education © 2020 [employer] ° °

## 2019-10-10 NOTE — Telephone Encounter (Signed)
Patient was contacted and will start virtual visit now. KW

## 2019-10-18 ENCOUNTER — Encounter: Payer: Self-pay | Admitting: Physician Assistant

## 2019-12-08 ENCOUNTER — Encounter (INDEPENDENT_AMBULATORY_CARE_PROVIDER_SITE_OTHER): Payer: 59 | Admitting: Physician Assistant

## 2019-12-08 DIAGNOSIS — Z3041 Encounter for surveillance of contraceptive pills: Secondary | ICD-10-CM

## 2019-12-10 MED ORDER — NORETHINDRONE ACET-ETHINYL EST 1.5-30 MG-MCG PO TABS: ORAL_TABLET | ORAL | 3 refills | Status: DC

## 2019-12-10 NOTE — Telephone Encounter (Signed)
Subjective:   Patient ID: Joy Garcia, female    DOB: [birth year], 33 y.o.   MRN: [id number]  Joy Garcia is a 33 y.o. female presenting on 12/08/2019 for Irregular Vaginal Bleeding  Virtual Visit via Telephone Note  I connected with Joy Garcia on 12/10/2019  by telephone and verified that I am speaking with the correct person using two identifiers.   I discussed the limitations, risks, security and privacy concerns of performing an evaluation and management service by telephone and the availability of in person appointments. I also discussed with the patient that there may be a patient responsible charge related to this service. The patient expressed understanding and agreed to proceed.   HPI  Patient presents today with vaginal bleeding x 2 months. Around the same time she was started on minipill for contraception. She has had vaginal bleeding or spotting for two months since then nearly every day. She is not having any pelvic pain, fever, chills, vaginal discharge.   Social History   Tobacco Use  . Smoking status: Never Smoker  . Smokeless tobacco: Never Used  Substance Use Topics  . Alcohol use: Yes  . Drug use: No    @ROSBYAGE @ Per HPI unless specifically indicated above  @SUBJECTIVEEND @  Objective:  @OBJNOHEADERBEGIN @ @VS @  Wt Readings from Last 3 Encounters:  09/10/19 200 lb (90.7 kg)  01/24/19 185 lb 6.4 oz (84.1 kg)  04/12/18 160 lb (72.6 kg)    @PHYSEXAM @ Results for orders placed or performed in visit on 09/10/19  Comprehensive Metabolic Panel (CMET)  Result Value Ref Range   Glucose 95 65 - 99 mg/dL   BUN 19 6 - 20 mg/dL   Creatinine, Ser 09/12/19 0.57 - 1.00 mg/dL   GFR calc non Af Amer 116 >59 mL/min/1.73   GFR calc Af Amer 134 >59 mL/min/1.73   BUN/Creatinine Ratio 28 (H) 9 - 23   Sodium 140 134 - 144 mmol/L   Potassium 4.1 3.5 - 5.2 mmol/L   Chloride 107 (H) 96 - 106 mmol/L   CO2 20 20 - 29 mmol/L   Calcium 9.5 8.7 - 10.2 mg/dL   Total  Protein 7.0 6.0 - 8.5 g/dL   Albumin 4.2 3.8 - 4.8 g/dL   Globulin, Total 2.8 1.5 - 4.5 g/dL   Albumin/Globulin Ratio 1.5 1.2 - 2.2   Bilirubin Total 0.3 0.0 - 1.2 mg/dL   Alkaline Phosphatase 92 39 - 117 IU/L   AST 18 0 - 40 IU/L   ALT 14 0 - 32 IU/L  TSH  Result Value Ref Range   TSH 0.794 0.450 - 4.500 uIU/mL  CBC with Differential  Result Value Ref Range   WBC 6.4 3.4 - 10.8 x10E3/uL   RBC 4.95 3.77 - 5.28 x10E6/uL   Hemoglobin 13.9 11.1 - 15.9 g/dL   Hematocrit 01/26/19 06/12/18 - 46.6 %   MCV 86 79 - 97 fL   MCH 28.1 26.6 - 33.0 pg   MCHC 32.9 31.5 - 35.7 g/dL   RDW 09/12/19 - 3.15 %   Platelets 220 150 - 450 x10E3/uL   Neutrophils 58 Not Estab. %   Lymphs 29 Not Estab. %   Monocytes 8 Not Estab. %   Eos 4 Not Estab. %   Basos 1 Not Estab. %   Neutrophils Absolute 3.7 1.4 - 7.0 x10E3/uL   Lymphocytes Absolute 1.8 0.7 - 3.1 x10E3/uL   Monocytes Absolute 0.5 0.1 - 0.9 x10E3/uL   EOS (ABSOLUTE) 0.3 0.0 - 0.4 x10E3/uL  Basophils Absolute 0.0 0.0 - 0.2 x10E3/uL   Immature Granulocytes 0 Not Estab. %   Immature Grans (Abs) 0.0 0.0 - 0.1 x10E3/uL  Lipid Profile  Result Value Ref Range   Cholesterol, Total 175 100 - 199 mg/dL   Triglycerides 54 0 - 149 mg/dL   HDL 52 >39 mg/dL   VLDL Cholesterol Cal 11 5 - 40 mg/dL   LDL Chol Calc (NIH) 112 (H) 0 - 99 mg/dL   Chol/HDL Ratio 3.4 0.0 - 4.4 ratio   Assessment & Plan:  1. Encounter for surveillance of contraceptive pills  We will change mini pill to combo OCP and she may skip the sugar pill week so she doesn't have a menstrual cycle.   - Norethindrone Acetate-Ethinyl Estradiol (JUNEL 1.5/30) 1.5-30 MG-MCG tablet; Take one active pill every day. Skip the sugar pills.  Dispense: 4 Package; Refill: 3   Follow up PRN  Carles Collet, PA-C [facility] 12/10/2019, 12:25 PM

## 2019-12-25 ENCOUNTER — Other Ambulatory Visit: Payer: Self-pay | Admitting: Physician Assistant

## 2019-12-25 DIAGNOSIS — F419 Anxiety disorder, unspecified: Secondary | ICD-10-CM

## 2019-12-25 DIAGNOSIS — F429 Obsessive-compulsive disorder, unspecified: Secondary | ICD-10-CM

## 2020-04-13 ENCOUNTER — Other Ambulatory Visit: Payer: Self-pay | Admitting: Physician Assistant

## 2020-04-13 DIAGNOSIS — F419 Anxiety disorder, unspecified: Secondary | ICD-10-CM

## 2020-04-13 DIAGNOSIS — F429 Obsessive-compulsive disorder, unspecified: Secondary | ICD-10-CM

## 2020-04-14 MED ORDER — FLUVOXAMINE MALEATE 100 MG PO TABS: 100.0000 mg | ORAL_TABLET | Freq: Every day | ORAL | 0 refills | Status: DC

## 2020-04-14 NOTE — Telephone Encounter (Signed)
Can we schedule her for anxiety follow up? I have refilled for 90 days.

## 2020-05-11 ENCOUNTER — Ambulatory Visit (INDEPENDENT_AMBULATORY_CARE_PROVIDER_SITE_OTHER)
Admission: RE | Admit: 2020-05-11 | Discharge: 2020-05-11 | Disposition: A | Discharge status: Home / Self Care | Payer: 59 | Source: Ambulatory Visit

## 2020-05-11 DIAGNOSIS — R05 Cough: Secondary | ICD-10-CM | POA: Diagnosis not present

## 2020-05-11 DIAGNOSIS — R062 Wheezing: Secondary | ICD-10-CM

## 2020-05-11 DIAGNOSIS — R059 Cough, unspecified: Secondary | ICD-10-CM

## 2020-05-11 DIAGNOSIS — R0602 Shortness of breath: Secondary | ICD-10-CM

## 2020-05-11 MED ORDER — BENZONATATE 100 MG PO CAPS: 100.0000 mg | ORAL_CAPSULE | Freq: Three times a day (TID) | ORAL | 0 refills | Status: DC | PRN

## 2020-05-11 MED ORDER — PREDNISONE 20 MG PO TABS
ORAL_TABLET | ORAL | 0 refills | Status: DC
Start: 2020-05-11 — End: 2020-05-20

## 2020-05-11 MED ORDER — PROMETHAZINE-DM 6.25-15 MG/5ML PO SYRP
5.0000 mL | ORAL_SOLUTION | Freq: Every evening | ORAL | 0 refills | Status: DC | PRN
Start: 2020-05-11 — End: 2021-04-12

## 2020-05-11 NOTE — Discharge Instructions (Addendum)
If your shortness of breath persists, cough and wheezing worsens then please report to the ER as you may need a chest CT scan to further evaluate your respiratory symptoms. Otherwise, we will trial another round of prednisone, cough medications as this helped but please make sure you keep your follow-up appointment with your PCP. You can consider referral to pulmonologist for further evaluation, it is ok to continue using the albuterol inhaler every 4-6 hours as needed in the meantime.

## 2020-05-11 NOTE — ED Provider Notes (Signed)
Virtual Visit via Video Note:  Joy Garcia  initiated request for Telemedicine visit with [facility] team. I connected with Joy Garcia  on 05/11/2020 at 10:11 AM  for a synchronized telemedicine visit using a video enabled HIPPA compliant telemedicine application. I verified that I am speaking with Joy Garcia  using two identifiers. Jaynee Eagles, PA-C  was physically located in a [facility]gent [facility] site and Adeola Dennen was located at a different location.   The limitations of evaluation and management by telemedicine as well as the availability of in-person appointments were discussed. Patient was informed that she  may incur a bill ( including co-pay) for this virtual visit encounter. Joy Garcia  expressed understanding and gave verbal consent to proceed with virtual visit.     History of Present Illness:Joy Garcia  is a 34 y.o. female presents with persistent cough, wheezing and intermittent shob. Sx started ~1 week ago, was seen at a different facility. Per pt had negative chest x-ray and was told she had developed adult asthma. Underwent prednisone course and was started on an albuterol inhaler. This helped patient but when she finished the steroid, after about a day, sx returned. She set up an appt with her PCP for ~10 days from now. Denies hx of lung disorder, smoking. She has had COVID vaccination and has low suspicion for this.   ROS  No current facility-administered medications for this encounter.   Current Outpatient Medications  Medication Sig Dispense Refill  . clonazePAM (KLONOPIN) 0.5 MG tablet Take 0.5 mg by mouth as needed for anxiety.    . fluvoxaMINE (LUVOX) 100 MG tablet Take 1 tablet (100 mg total) by mouth at bedtime. 90 tablet 0  . hydrOXYzine (ATARAX/VISTARIL) 10 MG tablet Take 1 tablet (10 mg total) by mouth at bedtime as needed. 30 tablet 0  . norethindrone (CAMILA) 0.35 MG tablet Take 1 tablet (0.35 mg total) by mouth  daily. 3 Package 3  . Norethindrone Acetate-Ethinyl Estradiol (JUNEL 1.5/30) 1.5-30 MG-MCG tablet Take one active pill every day. Skip the sugar pills. 4 Package 3  . phentermine 37.5 MG capsule Take 1 capsule (37.5 mg total) by mouth every morning. 30 capsule 1  . Prenatal Vit-Fe Fumarate-FA (MULTIVITAMIN-PRENATAL) 27-0.8 MG TABS tablet Take 1 tablet by mouth daily at 12 noon.    . traZODone (DESYREL) 50 MG tablet Take 0.5-1 tablets (25-50 mg total) by mouth at bedtime as needed for sleep. 30 tablet 5     Allergies  Allergen Reactions  . Wellbutrin [Bupropion] Anaphylaxis  . Amoxicillin Itching  . Bactrim [Sulfamethoxazole-Trimethoprim]   . Sulfa Antibiotics Itching     Past Medical History:  Diagnosis Date  . OCD (obsessive compulsive disorder)     No past surgical history on file.    Observations/Objective: Physical Exam Constitutional:      General: She is not in acute distress.    Appearance: Normal appearance. She is well-developed. She is not ill-appearing, toxic-appearing or diaphoretic.  Eyes:     Extraocular Movements: Extraocular movements intact.  Pulmonary:     Effort: Pulmonary effort is normal.  Neurological:     General: No focal deficit present.     Mental Status: She is alert and oriented to person, place, and time.  Psychiatric:        Mood and Affect: Mood normal.        Behavior: Behavior normal.        Thought Content: Thought content normal.  Judgment: Judgment normal.     Assessment and Plan:  PDMP not reviewed this encounter.  1. Cough   2. Wheezing   3. Shortness of breath    Discussed symptom set with patient and need for recheck, consideration for consult with pulmonologist. She is to keep her appt with her PCP. Patient requested another prednisone course since this helped. Discussed risks of and effects of using steroid courses, pt verbalized understanding. Would still like to do one more trial. Offered supportive care otherwise.  Counseled that if sx return would need to present to ER for chest CT or have her PCP order an outpatient CT prior to her visit in ~10 days. Patient verbalized understanding and is in agreement with tx plan.   Follow Up Instructions:    I discussed the assessment and treatment plan with the patient. The patient was provided an opportunity to ask questions and all were answered. The patient agreed with the plan and demonstrated an understanding of the instructions.   The patient was advised to call back or seek an in-person evaluation if the symptoms worsen or if the condition fails to improve as anticipated.  I provided 20 minutes of non-face-to-face time during this encounter.    Wallis Bamberg, PA-C  05/11/2020 10:11 AM         Wallis Bamberg, PA-C 05/11/20 1245

## 2020-05-20 ENCOUNTER — Ambulatory Visit: Payer: 59 | Admitting: Physician Assistant

## 2020-05-20 ENCOUNTER — Other Ambulatory Visit: Payer: Self-pay

## 2020-05-20 VITALS — BP 122/80 | HR 84 | Temp 97.1°F | Wt 190.0 lb

## 2020-05-20 DIAGNOSIS — F419 Anxiety disorder, unspecified: Secondary | ICD-10-CM

## 2020-05-20 DIAGNOSIS — J4520 Mild intermittent asthma, uncomplicated: Secondary | ICD-10-CM | POA: Diagnosis not present

## 2020-05-20 DIAGNOSIS — Z3041 Encounter for surveillance of contraceptive pills: Secondary | ICD-10-CM | POA: Diagnosis not present

## 2020-05-20 DIAGNOSIS — F429 Obsessive-compulsive disorder, unspecified: Secondary | ICD-10-CM

## 2020-05-20 MED ORDER — FLUTICASONE-SALMETEROL 100-50 MCG/DOSE IN AEPB: 1.0000 | INHALATION_SPRAY | Freq: Two times a day (BID) | RESPIRATORY_TRACT | 3 refills | Status: DC

## 2020-05-20 MED ORDER — FLUVOXAMINE MALEATE 100 MG PO TABS: 100.0000 mg | ORAL_TABLET | Freq: Every day | ORAL | 0 refills | Status: DC

## 2020-05-20 MED ORDER — NORETHINDRONE ACET-ETHINYL EST 1.5-30 MG-MCG PO TABS: ORAL_TABLET | ORAL | 3 refills | Status: DC

## 2020-05-20 NOTE — Patient Instructions (Signed)
Asthma, Adult  Asthma is a long-term (chronic) condition in which the airways get tight and narrow. The airways are the breathing passages that lead from the nose and mouth down into the lungs. A person with asthma will have times when symptoms get worse. These are called asthma attacks. They can cause coughing, whistling sounds when you breathe (wheezing), shortness of breath, and chest pain. They can make it hard to breathe. There is no cure for asthma, but medicines and lifestyle changes can help control it. There are many things that can bring on an asthma attack or make asthma symptoms worse (triggers). Common triggers include:  Mold.  Dust.  Cigarette smoke.  Cockroaches.  Things that can cause allergy symptoms (allergens). These include animal skin flakes (dander) and pollen from trees or grass.  Things that pollute the air. These may include household cleaners, wood smoke, smog, or chemical odors.  Cold air, weather changes, and wind.  Crying or laughing hard.  Stress.  Certain medicines or drugs.  Certain foods such as dried fruit, potato chips, and grape juice.  Infections, such as a cold or the flu.  Certain medical conditions or diseases.  Exercise or tiring activities. Asthma may be treated with medicines and by staying away from the things that cause asthma attacks. Types of medicines may include:  Controller medicines. These help prevent asthma symptoms. They are usually taken every day.  Fast-acting reliever or rescue medicines. These quickly relieve asthma symptoms. They are used as needed and provide short-term relief.  Allergy medicines if your attacks are brought on by allergens.  Medicines to help control the body's defense (immune) system. Follow these instructions at home: Avoiding triggers in your home  Change your heating and air conditioning filter often.  Limit your use of fireplaces and wood stoves.  Get rid of pests (such as roaches and  mice) and their droppings.  Throw away plants if you see mold on them.  Clean your floors. Dust regularly. Use cleaning products that do not smell.  Have someone vacuum when you are not home. Use a vacuum cleaner with a HEPA filter if possible.  Replace carpet with wood, tile, or vinyl flooring. Carpet can trap animal skin flakes and dust.  Use allergy-proof pillows, mattress covers, and box spring covers.  Wash bed sheets and blankets every week in hot water. Dry them in a dryer.  Keep your bedroom free of any triggers.  Avoid pets and keep windows closed when things that cause allergy symptoms are in the air.  Use blankets that are made of polyester or cotton.  Clean bathrooms and kitchens with bleach. If possible, have someone repaint the walls in these rooms with mold-resistant paint. Keep out of the rooms that are being cleaned and painted.  Wash your hands often with soap and water. If soap and water are not available, use hand sanitizer.  Do not allow anyone to smoke in your home. General instructions  Take over-the-counter and prescription medicines only as told by your doctor. ? Talk with your doctor if you have questions about how or when to take your medicines. ? Make note if you need to use your medicines more often than usual.  Do not use any products that contain nicotine or tobacco, such as cigarettes and e-cigarettes. If you need help quitting, ask your doctor.  Stay away from secondhand smoke.  Avoid doing things outdoors when allergen counts are high and when air quality is low.  Wear a ski mask   when doing outdoor activities in the winter. The mask should cover your nose and mouth. Exercise indoors on cold days if you can.  Warm up before you exercise. Take time to cool down after exercise.  Use a peak flow meter as told by your doctor. A peak flow meter is a tool that measures how well the lungs are working.  Keep track of the peak flow meter's readings.  Write them down.  Follow your asthma action plan. This is a written plan for taking care of your asthma and treating your attacks.  Make sure you get all the shots (vaccines) that your doctor recommends. Ask your doctor about a flu shot and a pneumonia shot.  Keep all follow-up visits as told by your doctor. This is important. Contact a doctor if:  You have wheezing, shortness of breath, or a cough even while taking medicine to prevent attacks.  The mucus you cough up (sputum) is thicker than usual.  The mucus you cough up changes from clear or white to yellow, green, gray, or bloody.  You have problems from the medicine you are taking, such as: ? A rash. ? Itching. ? Swelling. ? Trouble breathing.  You need reliever medicines more than 2-3 times a week.  Your peak flow reading is still at 50-79% of your personal best after following the action plan for 1 hour.  You have a fever. Get help right away if:  You seem to be worse and are not responding to medicine during an asthma attack.  You are short of breath even at rest.  You get short of breath when doing very little activity.  You have trouble eating, drinking, or talking.  You have chest pain or tightness.  You have a fast heartbeat.  Your lips or fingernails start to turn blue.  You are light-headed or dizzy, or you faint.  Your peak flow is less than 50% of your personal best.  You feel too tired to breathe normally. Summary  Asthma is a long-term (chronic) condition in which the airways get tight and narrow. An asthma attack can make it hard to breathe.  Asthma cannot be cured, but medicines and lifestyle changes can help control it.  Make sure you understand how to avoid triggers and how and when to use your medicines. This information is not intended to replace advice given to you by your health care provider. Make sure you discuss any questions you have with your health care provider. Document Revised:  01/31/2019 Document Reviewed: 01/02/2017 Elsevier Patient Education  2020 Elsevier Inc.  

## 2020-05-20 NOTE — Progress Notes (Signed)
Acute Office Visit  Subjective:    Patient ID: Joy Garcia, female    DOB: 01/11/1986, 34 y.o.   MRN: [id number]  Chief Complaint  Patient presents with  . Asthma    HPI Patient is in today for evaluation of asthma.  She reports that she had been coughing for about 3 weeks.  She was seen by [facility] on 05/04/20.   She was given albuterol inhaler and Prednisone.  She improved while on the steroids however, within 24 hours she was back to having difficulty.  She called and had a virtual visit with [facility] and they put her on another round of steroids and was instructed to follow up with her PCP.  She finished the Prednisone 5 days ago and is still having significant symptoms.  She continues with the Tessalon perles and inhaler but the cough keeps her up all night. Patient does have family history of her father having adult onset asthma.  She has an older brother without asthma.  She also admits to have extensive self and family history of allergies.  She is due for anxiety and birth control refills.   Past Medical History:  Diagnosis Date  . OCD (obsessive compulsive disorder)     No past surgical history on file.  Family History  Problem Relation Age of Onset  . Hyperlipidemia Mother   . Hypertension Mother   . Depression Mother   . Cancer Father        skin cancer  . Atrial fibrillation Father   . Hyperlipidemia Father   . Hypertension Father   . Heart disease Father   . Stroke Father     Social History   Socioeconomic History  . Marital status: Single    Spouse name: Not on file  . Number of children: Not on file  . Years of education: Not on file  . Highest education level: Not on file  Occupational History  . Not on file  Tobacco Use  . Smoking status: Never Smoker  . Smokeless tobacco: Never Used  Substance and Sexual Activity  . Alcohol use: Yes  . Drug use: No  . Sexual activity: Yes    Partners: Male    Birth control/protection: None  Other  Topics Concern  . Not on file  Social History Narrative  . Not on file   Social Determinants of Health   Financial Resource Strain:   . Difficulty of Paying Living Expenses:   Food Insecurity:   . Worried About Programme researcher, broadcasting/film/video in the Last Year:   . Barista in the Last Year:   Transportation Needs:   . Freight forwarder (Medical):   Marland Kitchen Lack of Transportation (Non-Medical):   Physical Activity:   . Days of Exercise per Week:   . Minutes of Exercise per Session:   Stress:   . Feeling of Stress :   Social Connections:   . Frequency of Communication with Friends and Family:   . Frequency of Social Gatherings with Friends and Family:   . Attends Religious Services:   . Active Member of Clubs or Organizations:   . Attends Banker Meetings:   Marland Kitchen Marital Status:   Intimate Partner Violence:   . Fear of Current or Ex-Partner:   . Emotionally Abused:   Marland Kitchen Physically Abused:   . Sexually Abused:     Outpatient Medications Prior to Visit  Medication Sig Dispense Refill  . benzonatate (TESSALON) 100 MG  capsule Take 1-2 capsules (100-200 mg total) by mouth 3 (three) times daily as needed. 60 capsule 0  . clonazePAM (KLONOPIN) 0.5 MG tablet Take 0.5 mg by mouth as needed for anxiety.    . fluvoxaMINE (LUVOX) 100 MG tablet Take 1 tablet (100 mg total) by mouth at bedtime. 90 tablet 0  . Norethindrone Acetate-Ethinyl Estradiol (JUNEL 1.5/30) 1.5-30 MG-MCG tablet Take one active pill every day. Skip the sugar pills. 4 Package 3  . promethazine-dextromethorphan (PROMETHAZINE-DM) 6.25-15 MG/5ML syrup Take 5 mLs by mouth at bedtime as needed for cough. 100 mL 0  . traZODone (DESYREL) 50 MG tablet Take 0.5-1 tablets (25-50 mg total) by mouth at bedtime as needed for sleep. 30 tablet 5  . hydrOXYzine (ATARAX/VISTARIL) 10 MG tablet Take 1 tablet (10 mg total) by mouth at bedtime as needed. 30 tablet 0  . norethindrone (CAMILA) 0.35 MG tablet Take 1 tablet (0.35 mg total)  by mouth daily. 3 Package 3  . phentermine 37.5 MG capsule Take 1 capsule (37.5 mg total) by mouth every morning. 30 capsule 1  . predniSONE (DELTASONE) 20 MG tablet Take 2 tablets daily with breakfast. 10 tablet 0  . Prenatal Vit-Fe Fumarate-FA (MULTIVITAMIN-PRENATAL) 27-0.8 MG TABS tablet Take 1 tablet by mouth daily at 12 noon.     No facility-administered medications prior to visit.    Allergies  Allergen Reactions  . Wellbutrin [Bupropion] Anaphylaxis  . Amoxicillin Itching  . Bactrim [Sulfamethoxazole-Trimethoprim]   . Sulfa Antibiotics Itching    Review of Systems  Constitutional: Positive for fatigue (lack of sleep due to cough). Negative for chills and fever.  Respiratory: Positive for cough, chest tightness, shortness of breath and wheezing. Negative for apnea.   Cardiovascular: Negative for chest pain, palpitations and leg swelling.       Objective:    Physical Exam Constitutional:      Appearance: Normal appearance. She is normal weight.  HENT:     Head: Normocephalic and atraumatic.  Cardiovascular:     Rate and Rhythm: Normal rate and regular rhythm.     Pulses: Normal pulses.     Heart sounds: Normal heart sounds.  Pulmonary:     Effort: Pulmonary effort is normal.     Breath sounds: Normal breath sounds.  Skin:    General: Skin is warm and dry.  Neurological:     General: No focal deficit present.     Mental Status: She is alert and oriented to person, place, and time. Mental status is at baseline.  Psychiatric:        Mood and Affect: Mood normal.        Behavior: Behavior normal.        Thought Content: Thought content normal.        Judgment: Judgment normal.     BP 122/80 (BP Location: Right Arm, Patient Position: Sitting, Cuff Size: Normal)   Pulse 84   Temp (!) 97.1 F (36.2 C) (Skin)   Wt 190 lb (86.2 kg)   SpO2 99%   BMI 28.89 kg/m  Wt Readings from Last 3 Encounters:  05/20/20 190 lb (86.2 kg)  09/10/19 200 lb (90.7 kg)  01/24/19  185 lb 6.4 oz (84.1 kg)    Health Maintenance Due  Topic Date Due  . COVID-19 Vaccine (1) Never done  . TETANUS/TDAP  Never done    There are no preventive care reminders to display for this patient.   Lab Results  Component Value Date   TSH 0.794  09/11/2019   Lab Results  Component Value Date   WBC 6.4 09/11/2019   HGB 13.9 09/11/2019   HCT 42.3 09/11/2019   MCV 86 09/11/2019   PLT 220 09/11/2019   Lab Results  Component Value Date   NA 140 09/11/2019   K 4.1 09/11/2019   CO2 20 09/11/2019   GLUCOSE 95 09/11/2019   BUN 19 09/11/2019   CREATININE 0.68 09/11/2019   BILITOT 0.3 09/11/2019   ALKPHOS 92 09/11/2019   AST 18 09/11/2019   ALT 14 09/11/2019   PROT 7.0 09/11/2019   ALBUMIN 4.2 09/11/2019   CALCIUM 9.5 09/11/2019   Lab Results  Component Value Date   CHOL 175 09/11/2019   Lab Results  Component Value Date   HDL 52 09/11/2019   Lab Results  Component Value Date   LDLCALC 112 (H) 09/11/2019   Lab Results  Component Value Date   TRIG 54 09/11/2019   Lab Results  Component Value Date   CHOLHDL 3.4 09/11/2019   No results found for: HGBA1C     Assessment & Plan:   Problem List Items Addressed This Visit    None      1. Mild intermittent asthma, unspecified whether complicated Will try daily inhaler for about 1 month.  After allergy season she may not need to take this.  - Fluticasone-Salmeterol (ADVAIR) 100-50 MCG/DOSE AEPB; Inhale 1 puff into the lungs 2 (two) times daily.  Dispense: 1 each; Refill: 3  2. Encounter for surveillance of contraceptive pills  - Norethindrone Acetate-Ethinyl Estradiol (JUNEL 1.5/30) 1.5-30 MG-MCG tablet; Take one active pill every day. Skip the sugar pills.  Dispense: 4 Package; Refill: 3  3. Anxiety  - fluvoxaMINE (LUVOX) 100 MG tablet; Take 1 tablet (100 mg total) by mouth at bedtime.  Dispense: 90 tablet; Refill: 0  4. Obsessive-compulsive disorder, unspecified type  - fluvoxaMINE (LUVOX) 100 MG  tablet; Take 1 tablet (100 mg total) by mouth at bedtime.  Dispense: 90 tablet; Refill: 0  Fu 1 year chronic

## 2020-05-21 ENCOUNTER — Encounter: Payer: Self-pay | Admitting: Physician Assistant

## 2020-05-21 DIAGNOSIS — Z3041 Encounter for surveillance of contraceptive pills: Secondary | ICD-10-CM

## 2020-05-21 MED ORDER — NORETHINDRONE ACET-ETHINYL EST 1.5-30 MG-MCG PO TABS: 1.0000 | ORAL_TABLET | Freq: Every day | ORAL | 3 refills | Status: DC

## 2020-05-21 NOTE — Addendum Note (Signed)
Addended by: [name] on: 05/21/2020 03:02 PM   Modules accepted: Orders

## 2020-07-14 NOTE — Progress Notes (Signed)
Joy Garcia,acting as a Neurosurgeon for Joy Sailors, PA-C.,have documented all relevant documentation on the behalf of Joy Sailors, PA-C,as directed by  Joy Sailors, PA-C while in the presence of Joy Sailors, PA-C.  Established patient visit   Patient: Joy Garcia   DOB: December 20, 1985   34 y.o. Female  MRN: [id number] Visit Date: 07/15/2020  Today's healthcare provider: [name], PA-C   Chief Complaint  Patient presents with  . Insect Bite   Subjective    HPI   Patient presents today in office for multiple bug bites, patient says that she first noticed the bug bites on Sunday. Bites are in a cluster on patient's right arm, said that they have been itchy, red and feverish. Patient says that she has not had any fever or any other symptoms. Patient said that she tried hydrocortisone cream and bacitracin on the bites and it stopped the itching a little.    Patient complaining of ear pain.   Patient says that she has been having extreme fatigue, and falls asleep all the time.   Results of the [facility] flowsheet 07/15/2020  Sitting and reading 2  Watching TV 2  Sitting, inactive in a public place (e.g. a theatre or a meeting) 1  As a passenger in a car for an hour without a break 3  Lying down to rest in the afternoon when circumstances permit 3  Sitting and talking to someone 0  Sitting quietly after a lunch without alcohol 1  In a car, while stopped for a few minutes in traffic 0  Total score 12       Medications: Outpatient Medications Prior to Visit  Medication Sig  . clonazePAM (KLONOPIN) 0.5 MG tablet Take 0.5 mg by mouth as needed for anxiety.  . Fluticasone-Salmeterol (ADVAIR) 100-50 MCG/DOSE AEPB Inhale 1 puff into the lungs 2 (two) times daily.  . Norethindrone Acetate-Ethinyl Estradiol (HAILEY 1.5/30) 1.5-30 MG-MCG tablet Take 1 tablet by mouth daily.  . promethazine-dextromethorphan (PROMETHAZINE-DM) 6.25-15 MG/5ML syrup Take 5 mLs by  mouth at bedtime as needed for cough.  . traZODone (DESYREL) 50 MG tablet Take 0.5-1 tablets (25-50 mg total) by mouth at bedtime as needed for sleep.  . [DISCONTINUED] albuterol (VENTOLIN HFA) 108 (90 Base) MCG/ACT inhaler Inhale 2 puffs into the lungs every 6 (six) hours as needed for wheezing or shortness of breath.  . [DISCONTINUED] fluvoxaMINE (LUVOX) 100 MG tablet Take 1 tablet (100 mg total) by mouth at bedtime.  . benzonatate (TESSALON) 100 MG capsule Take 1-2 capsules (100-200 mg total) by mouth 3 (three) times daily as needed. (Patient not taking: Reported on 07/15/2020)   No facility-administered medications prior to visit.    Review of Systems    Objective    BP 125/85 (BP Location: Left Arm, Patient Position: Sitting, Cuff Size: Large)   Pulse 75   Temp 99 F (37.2 C) (Oral)   Wt 190 lb 9.6 oz (86.5 kg)   BMI 28.98 kg/m    Physical Exam Vitals reviewed.  Constitutional:      Appearance: Normal appearance.  HENT:     Head: Normocephalic and atraumatic.     Right Ear: External ear normal.     Left Ear: External ear normal.  Eyes:     General: No scleral icterus.    Conjunctiva/sclera: Conjunctivae normal.  Cardiovascular:     Rate and Rhythm: Normal rate and regular rhythm.     Pulses: Normal pulses.  Heart sounds: Normal heart sounds.  Pulmonary:     Effort: Pulmonary effort is normal.     Breath sounds: Normal breath sounds.  Musculoskeletal:     Right lower leg: No edema.     Left lower leg: No edema.  Skin:    General: Skin is warm and dry.     Findings: Rash present.       Neurological:     General: No focal deficit present.     Mental Status: She is alert and oriented to person, place, and time.  Psychiatric:        Mood and Affect: Mood normal.        Behavior: Behavior normal.        Thought Content: Thought content normal.        Judgment: Judgment normal.       Results for orders placed or performed in visit on 07/15/20  TSH  Result  Value Ref Range   TSH 0.748 0.450 - 4.500 uIU/mL  CBC with Differential/Platelet  Result Value Ref Range   WBC 7.0 3.4 - 10.8 x10E3/uL   RBC 5.11 3.77 - 5.28 x10E6/uL   Hemoglobin 14.7 11.1 - 15.9 g/dL   Hematocrit 96.7 59.1 - 46.6 %   MCV 89 79 - 97 fL   MCH 28.8 26.6 - 33.0 pg   MCHC 32.5 31 - 35 g/dL   RDW 63.8 46.6 - 59.9 %   Platelets 230 150 - 450 x10E3/uL   Neutrophils 57 Not Estab. %   Lymphs 32 Not Estab. %   Monocytes 7 Not Estab. %   Eos 3 Not Estab. %   Basos 1 Not Estab. %   Neutrophils Absolute 3.9 1 - 7 x10E3/uL   Lymphocytes Absolute 2.3 0 - 3 x10E3/uL   Monocytes Absolute 0.5 0 - 0 x10E3/uL   EOS (ABSOLUTE) 0.2 0.0 - 0.4 x10E3/uL   Basophils Absolute 0.1 0 - 0 x10E3/uL   Immature Granulocytes 0 Not Estab. %   Immature Grans (Abs) 0.0 0.0 - 0.1 x10E3/uL  Comprehensive metabolic panel  Result Value Ref Range   Glucose 99 65 - 99 mg/dL   BUN 16 6 - 20 mg/dL   Creatinine, Ser 3.57 0.57 - 1.00 mg/dL   GFR calc non Af Amer 109 >59 mL/min/1.73   GFR calc Af Amer 125 >59 mL/min/1.73   BUN/Creatinine Ratio 22 9 - 23   Sodium 138 134 - 144 mmol/L   Potassium 4.6 3.5 - 5.2 mmol/L   Chloride 104 96 - 106 mmol/L   CO2 20 20 - 29 mmol/L   Calcium 9.6 8.7 - 10.2 mg/dL   Total Protein 7.2 6.0 - 8.5 g/dL   Albumin 4.4 3.8 - 4.8 g/dL   Globulin, Total 2.8 1.5 - 4.5 g/dL   Albumin/Globulin Ratio 1.6 1.2 - 2.2   Bilirubin Total 0.2 0.0 - 1.2 mg/dL   Alkaline Phosphatase 63 48 - 121 IU/L   AST 16 0 - 40 IU/L   ALT 10 0 - 32 IU/L    Assessment & Plan    1. Bug bite, initial encounter  - triamcinolone cream (KENALOG) 0.1 %; Apply 1 application topically 2 (two) times daily.  Dispense: 30 g; Refill: 0  2. Fatigue, unspecified type  - TSH - CBC with Differential/Platelet - Comprehensive metabolic panel - Home sleep test  3. Anxiety  - fluvoxaMINE (LUVOX) 100 MG tablet; Take 1 tablet (100 mg total) by mouth at bedtime.  Dispense: 90 tablet;  Refill: 1  4.  Obsessive-compulsive disorder, unspecified type  - fluvoxaMINE (LUVOX) 100 MG tablet; Take 1 tablet (100 mg total) by mouth at bedtime.  Dispense: 90 tablet; Refill: 1    No follow-ups on file.      ITrey Sailors, PA-C, have reviewed all documentation for this visit. The documentation on 08/11/20 for the exam, diagnosis, procedures, and orders are all accurate and complete.    Maryella Shivers  [facility] [phone] (phone) [phone] (fax)  [facility]

## 2020-07-15 ENCOUNTER — Ambulatory Visit: Payer: 59 | Admitting: Physician Assistant

## 2020-07-15 ENCOUNTER — Other Ambulatory Visit: Payer: Self-pay

## 2020-07-15 ENCOUNTER — Encounter: Payer: Self-pay | Admitting: Physician Assistant

## 2020-07-15 VITALS — BP 125/85 | HR 75 | Temp 99.0°F | Wt 190.6 lb

## 2020-07-15 DIAGNOSIS — F419 Anxiety disorder, unspecified: Secondary | ICD-10-CM

## 2020-07-15 DIAGNOSIS — R5383 Other fatigue: Secondary | ICD-10-CM | POA: Diagnosis not present

## 2020-07-15 DIAGNOSIS — F429 Obsessive-compulsive disorder, unspecified: Secondary | ICD-10-CM

## 2020-07-15 DIAGNOSIS — W57XXXA Bitten or stung by nonvenomous insect and other nonvenomous arthropods, initial encounter: Secondary | ICD-10-CM | POA: Diagnosis not present

## 2020-07-15 MED ORDER — ALBUTEROL SULFATE HFA 108 (90 BASE) MCG/ACT IN AERS: 2.0000 | INHALATION_SPRAY | Freq: Four times a day (QID) | RESPIRATORY_TRACT | 2 refills | Status: DC | PRN

## 2020-07-15 MED ORDER — TRIAMCINOLONE ACETONIDE 0.1 % EX CREA: 1.0000 "application " | TOPICAL_CREAM | Freq: Two times a day (BID) | CUTANEOUS | 0 refills | Status: DC

## 2020-07-15 MED ORDER — FLUVOXAMINE MALEATE 100 MG PO TABS: 100.0000 mg | ORAL_TABLET | Freq: Every day | ORAL | 1 refills | Status: DC

## 2020-07-16 LAB — COMPREHENSIVE METABOLIC PANEL
ALT: 10 IU/L (ref 0–32)
AST: 16 IU/L (ref 0–40)
Albumin/Globulin Ratio: 1.6 (ref 1.2–2.2)
Albumin: 4.4 g/dL (ref 3.8–4.8)
Alkaline Phosphatase: 63 IU/L (ref 48–121)
BUN/Creatinine Ratio: 22 (ref 9–23)
BUN: 16 mg/dL (ref 6–20)
Bilirubin Total: 0.2 mg/dL (ref 0.0–1.2)
CO2: 20 mmol/L (ref 20–29)
Calcium: 9.6 mg/dL (ref 8.7–10.2)
Chloride: 104 mmol/L (ref 96–106)
Creatinine, Ser: 0.73 mg/dL (ref 0.57–1.00)
GFR calc Af Amer: 125 mL/min/{1.73_m2} (ref >59)
GFR calc non Af Amer: 109 mL/min/{1.73_m2} (ref >59)
Globulin, Total: 2.8 g/dL (ref 1.5–4.5)
Glucose: 99 mg/dL (ref 65–99)
Potassium: 4.6 mmol/L (ref 3.5–5.2)
Sodium: 138 mmol/L (ref 134–144)
Total Protein: 7.2 g/dL (ref 6.0–8.5)

## 2020-07-16 LAB — CBC WITH DIFFERENTIAL/PLATELET
Basophils Absolute: 0.1 10*3/uL (ref 0.0–0.2)
Basos: 1 %
EOS (ABSOLUTE): 0.2 10*3/uL (ref 0.0–0.4)
Eos: 3 %
Hematocrit: 45.2 % (ref 34.0–46.6)
Hemoglobin: 14.7 g/dL (ref 11.1–15.9)
Immature Grans (Abs): 0 10*3/uL (ref 0.0–0.1)
Immature Granulocytes: 0 %
Lymphocytes Absolute: 2.3 10*3/uL (ref 0.7–3.1)
Lymphs: 32 %
MCH: 28.8 pg (ref 26.6–33.0)
MCHC: 32.5 g/dL (ref 31.5–35.7)
MCV: 89 fL (ref 79–97)
Monocytes Absolute: 0.5 10*3/uL (ref 0.1–0.9)
Monocytes: 7 %
Neutrophils Absolute: 3.9 10*3/uL (ref 1.4–7.0)
Neutrophils: 57 %
Platelets: 230 10*3/uL (ref 150–450)
RBC: 5.11 x10E6/uL (ref 3.77–5.28)
RDW: 12.3 % (ref 11.7–15.4)
WBC: 7 10*3/uL (ref 3.4–10.8)

## 2020-07-16 LAB — TSH: TSH: 0.748 u[IU]/mL (ref 0.450–4.500)

## 2020-09-16 ENCOUNTER — Other Ambulatory Visit: Payer: Self-pay | Admitting: Physician Assistant

## 2020-09-16 DIAGNOSIS — J4520 Mild intermittent asthma, uncomplicated: Secondary | ICD-10-CM

## 2020-10-26 ENCOUNTER — Other Ambulatory Visit: Payer: Self-pay | Admitting: Physician Assistant

## 2020-10-26 DIAGNOSIS — G4700 Insomnia, unspecified: Secondary | ICD-10-CM

## 2020-10-26 NOTE — Telephone Encounter (Signed)
Requested medication (s) are due for refill today: Yes  Requested medication (s) are on the active medication list: Yes  Last refill:  10/10/19  Future visit scheduled: No  Notes to clinic:  Prescription has expired.    Requested Prescriptions  Pending Prescriptions Disp Refills   traZODone (DESYREL) 50 MG tablet [Pharmacy Med Name: traZODone 50 MG TABLET] 30 tablet 5    Sig: TAKE 0.5-1 TABLETS BY MOUTH EVERY NIGHT AT BEDTIME AS NEEDED FOR SLEEP      Psychiatry: Antidepressants - Serotonin Modulator Passed - 10/26/2020  9:16 AM      Passed - Valid encounter within last 6 months    Recent Outpatient Visits           3 months ago Bug bite, initial encounter   [employer], [name], PA-C   5 months ago Mild intermittent asthma, unspecified whether complicated   [facility] [place], [name] M, PA-C   1 year ago Weight gain   [facility] [name], [name] M, [place]   1 year ago Class 1 obesity without serious comorbidity in adult, unspecified BMI, unspecified obesity type   [facility] [name], [name], [place]   1 year ago Tinea cruris   [facility] [place], [name] [place], [place]

## 2020-11-23 ENCOUNTER — Other Ambulatory Visit: Payer: Self-pay | Admitting: Physician Assistant

## 2020-11-23 DIAGNOSIS — J4520 Mild intermittent asthma, uncomplicated: Secondary | ICD-10-CM

## 2020-11-23 NOTE — Telephone Encounter (Signed)
Requested Prescriptions  Pending Prescriptions Disp Refills   Fluticasone-Salmeterol (ADVAIR) 100-50 MCG/DOSE AEPB [Pharmacy Med Name: FLUTICASONE-SALMETEROL 100-50] 60 each 1    Sig: INHALE ONE PUFF BY MOUTH TWICE A DAY     Pulmonology:  Combination Products Passed - 11/23/2020 10:55 AM      Passed - Valid encounter within last 12 months    Recent Outpatient Visits          4 months ago Bug bite, initial encounter   [employer], [name], PA-C   6 months ago Mild intermittent asthma, unspecified whether complicated   [facility] [place], [name] M, PA-C   1 year ago Weight gain   [facility] [place], [name] M, [place]   1 year ago Class 1 obesity without serious comorbidity in adult, unspecified BMI, unspecified obesity type   [facility] [place], [name], [place]   1 year ago Tinea cruris   [facility] [place], [name] [place], [place]

## 2021-01-23 ENCOUNTER — Other Ambulatory Visit: Payer: Self-pay | Admitting: Physician Assistant

## 2021-01-23 DIAGNOSIS — J4520 Mild intermittent asthma, uncomplicated: Secondary | ICD-10-CM

## 2021-01-25 ENCOUNTER — Other Ambulatory Visit: Payer: Self-pay

## 2021-01-25 DIAGNOSIS — Z3041 Encounter for surveillance of contraceptive pills: Secondary | ICD-10-CM

## 2021-01-25 NOTE — Telephone Encounter (Signed)
[facility] faxed refill request for the following medications:   Norethindrone Acetate-Ethinyl Estradiol (HAILEY 1.5/30) 1.5-30 MG-MCGLast Rx:  Please advise. Thanks, [employer]

## 2021-01-26 MED ORDER — NORETHINDRONE ACET-ETHINYL EST 1.5-30 MG-MCG PO TABS: 1.0000 | ORAL_TABLET | Freq: Every day | ORAL | 3 refills | Status: DC

## 2021-01-26 NOTE — Telephone Encounter (Signed)
Please review. Thanks!  

## 2021-02-01 MED ORDER — NORETHINDRONE ACET-ETHINYL EST 1.5-30 MG-MCG PO TABS: 1.0000 | ORAL_TABLET | Freq: Every day | ORAL | 3 refills | Status: DC

## 2021-02-01 NOTE — Addendum Note (Signed)
Addended by: [name] on: 02/01/2021 03:32 PM   Modules accepted: Orders

## 2021-04-07 ENCOUNTER — Telehealth: Payer: Self-pay

## 2021-04-07 NOTE — Telephone Encounter (Signed)
[facility] faxed refill request for the following medications:  Fluticasone-Salmeterol (ADVAIR) 100-50 MCG/DOSE AEPB   Please advise.

## 2021-04-08 MED ORDER — FLUTICASONE-SALMETEROL 100-50 MCG/ACT IN AEPB: 1.0000 | INHALATION_SPRAY | Freq: Two times a day (BID) | RESPIRATORY_TRACT | 5 refills | Status: DC

## 2021-04-12 ENCOUNTER — Ambulatory Visit: Payer: 59 | Admitting: Family Medicine

## 2021-04-12 ENCOUNTER — Other Ambulatory Visit: Payer: Self-pay

## 2021-04-12 ENCOUNTER — Encounter: Payer: Self-pay | Admitting: Family Medicine

## 2021-04-12 VITALS — Temp 98.7°F | Resp 16 | Ht 67.0 in | Wt 211.6 lb

## 2021-04-12 DIAGNOSIS — Z23 Encounter for immunization: Secondary | ICD-10-CM

## 2021-04-12 DIAGNOSIS — E66811 Obesity, class 1: Secondary | ICD-10-CM | POA: Insufficient documentation

## 2021-04-12 DIAGNOSIS — E669 Obesity, unspecified: Secondary | ICD-10-CM | POA: Diagnosis not present

## 2021-04-12 DIAGNOSIS — J4520 Mild intermittent asthma, uncomplicated: Secondary | ICD-10-CM | POA: Insufficient documentation

## 2021-04-12 DIAGNOSIS — J4540 Moderate persistent asthma, uncomplicated: Secondary | ICD-10-CM

## 2021-04-12 DIAGNOSIS — J309 Allergic rhinitis, unspecified: Secondary | ICD-10-CM

## 2021-04-12 MED ORDER — TOPIRAMATE 50 MG PO TABS: ORAL_TABLET | ORAL | 1 refills | Status: DC

## 2021-04-12 MED ORDER — MONTELUKAST SODIUM 10 MG PO TABS: 10.0000 mg | ORAL_TABLET | Freq: Every day | ORAL | 3 refills | Status: DC

## 2021-04-12 MED ORDER — PHENTERMINE HCL 15 MG PO CAPS: 15.0000 mg | ORAL_CAPSULE | ORAL | 1 refills | Status: DC

## 2021-04-12 MED ORDER — FLUTICASONE-SALMETEROL 250-50 MCG/ACT IN AEPB: 1.0000 | INHALATION_SPRAY | Freq: Two times a day (BID) | RESPIRATORY_TRACT | 2 refills | Status: DC

## 2021-04-12 NOTE — Progress Notes (Signed)
Established patient visit   Patient: Joy Garcia   DOB: Mar 23, 1986   35 y.o. Female  MRN: [id number] Visit Date: 04/12/2021  Today's healthcare provider: Mila Merry, MD   Chief Complaint  Patient presents with  . Asthma  . Anxiety  . Obesity   Subjective    HPI  Follow up for Asthma  The patient was last seen for this 11 months ago. Changes made at last visit include will try daily Advair inhaler for about 1 month.  She reports good compliance with treatment. She feels that condition is Worse. Has had trouble breathing since the warmer spring weather. 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  She is not having side effects.   She is also having a lot more trouble with allergies the last several weeks.  -----------------------------------------------------------------------------------------  Anxiety and obsessive compulsive disorder, Follow-up  She was last seen for anxiety 9 months ago (seen by Osvaldo Angst, PA-C). Changes made at last visit include none; continue fluvoxaMINE (LUVOX) 100 MG tablet at bedtime .   She reports good compliance with treatment. She reports good tolerance of treatment. She is not having side effects.   She feels her anxiety is moderate and Improved since last visit.  Symptoms: No chest pain No difficulty concentrating  No dizziness Yes fatigue  No feelings of losing control No insomnia  Yes irritable No palpitations  No panic attacks No racing thoughts  Yes shortness of breath No sweating  No tremors/shakes    GAD-7 Results No flowsheet data found.  PHQ-9 Scores PHQ9 SCORE ONLY 04/12/2021  PHQ-9 Total Score 4    ---------------------------------------------------------------------------------------------------  Obesity:  Patient would like to discuss options to help with weight loss. She states that she has been very fatigued lately to where she wants to fall  asleep during the day. Patient states she exercises 3 times a week and has not been able to lose weight. Her diet has been generally healthy. She has tried using Phentermine 37.5mg  back in 2020 and states she initially lost weight, but it didn't stay off. She also states she felt a little strange when she was taking it.       Medications: Outpatient Medications Prior to Visit  Medication Sig  . albuterol (VENTOLIN HFA) 108 (90 Base) MCG/ACT inhaler Inhale 2 puffs into the lungs every 6 (six) hours as needed for wheezing or shortness of breath.  . clonazePAM (KLONOPIN) 0.5 MG tablet Take 0.5 mg by mouth as needed for anxiety.  . fluticasone-salmeterol 100-50 MCG/ACT AEPB Inhale 1 puff into the lungs 2 (two) times daily.  . fluvoxaMINE (LUVOX) 100 MG tablet Take 1 tablet (100 mg total) by mouth at bedtime.  . Norethindrone Acetate-Ethinyl Estradiol (HAILEY 1.5/30) 1.5-30 MG-MCG tablet Take 1 tablet by mouth daily.  . traZODone (DESYREL) 50 MG tablet TAKE 0.5-1 TABLETS BY MOUTH EVERY NIGHT AT BEDTIME AS NEEDED FOR SLEEP  . [DISCONTINUED] benzonatate (TESSALON) 100 MG capsule Take 1-2 capsules (100-200 mg total) by mouth 3 (three) times daily as needed. (Patient not taking: Reported on 07/15/2020)  . [DISCONTINUED] promethazine-dextromethorphan (PROMETHAZINE-DM) 6.25-15 MG/5ML syrup Take 5 mLs by mouth at bedtime as needed for cough.  . [DISCONTINUED] triamcinolone cream (KENALOG) 0.1 % Apply 1 application topically 2 (two) times daily.   No facility-administered medications prior to visit.    Review of Systems  Constitutional: Positive for fatigue. Negative for appetite change, chills and fever.  Respiratory:  Positive for shortness of breath. Negative for chest tightness.   Cardiovascular: Negative for chest pain and palpitations.  Gastrointestinal: Negative for abdominal pain, nausea and vomiting.  Neurological: Negative for dizziness and weakness.       Objective    Temp 98.7 F (37.1  C) (Temporal)   Resp 16   Ht 5\' 7"  (1.702 m)   Wt 211 lb 9.6 oz (96 kg)   LMP 04/12/2021 (Exact Date)   BMI 33.14 kg/m     Physical Exam   General appearance: Mildly obese female, cooperative and in no acute distress Head: Normocephalic, without obvious abnormality, atraumatic Respiratory: Respirations even and unlabored, normal respiratory rate Extremities: All extremities are intact.  Skin: Skin color, texture, turgor normal. No rashes seen  Psych: Appropriate mood and affect. Neurologic: Mental status: Alert, oriented to person, place, and time, thought content appropriate.     Assessment & Plan     1. Allergic rhinitis, unspecified seasonality, unspecified trigger Add montelukast (SINGULAIR) 10 MG tablet; Take 1 tablet (10 mg total) by mouth at bedtime.  Dispense: 30 tablet; Refill: 3  2. Moderate persistent asthma without complication Uncontrolled, using rescue inhaler most days, especially when physically exerting herself.  Increase  fluticasone-salmeterol (ADVAIR DISKUS) to 250-50 MCG/ACT AEPB; Inhale 1 puff into the lungs in the morning and at bedtime.  Dispense: 60 each; Refill: 2 add montelukast (SINGULAIR) 10 MG tablet; Take 1 tablet (10 mg total) by mouth at bedtime.  Dispense: 30 tablet; Refill: 3  Discussed adding LAMA if not improved at follow up .    3. Class 1 obesity without serious comorbidity in adult, unspecified BMI, unspecified obesity type She remains as active as she can but is limited by asthma as above. She had initial weight loss with 37.5mg  phentermine in the past, but did not sustain weight loss. Will try - topiramate (TOPAMAX) 50 MG tablet; Take 1/2 tablet daily for 14 days, then increase to 1 tablet daily  Dispense: 30 tablet; Refill: 1 - phentermine 15 MG capsule; Take 1 capsule (15 mg total) by mouth every morning.  Dispense: 30 capsule; Refill: 1  Also discussed GLP-1 agonists and recommend she check insurance coverage. Would also consider  Contrave if not improving with above.    4. Need for tetanus, diphtheria, and acellular pertussis (Tdap) vaccine in patient of adolescent age or older  - Administer Tetanus-diphtheria-acellular pertussis (Tdap) vaccine   Future Appointments  Date Time Provider Department Center  05/26/2021  9:40 AM 05/28/2021, Sherrie Mustache, MD [facility]             Demetrios Isaacs, MD  [facility] [phone] (phone) [phone] (fax)  [facility]

## 2021-04-30 ENCOUNTER — Other Ambulatory Visit: Payer: Self-pay

## 2021-04-30 DIAGNOSIS — F429 Obsessive-compulsive disorder, unspecified: Secondary | ICD-10-CM

## 2021-04-30 DIAGNOSIS — F419 Anxiety disorder, unspecified: Secondary | ICD-10-CM

## 2021-04-30 MED ORDER — FLUVOXAMINE MALEATE 100 MG PO TABS: 100.0000 mg | ORAL_TABLET | Freq: Every day | ORAL | 4 refills | Status: DC

## 2021-04-30 NOTE — Telephone Encounter (Signed)
[facility] faxed refill request for the following medications:  fluvoxaMINE (LUVOX) 100 MG tablet   Please advise.

## 2021-05-26 ENCOUNTER — Ambulatory Visit: Payer: 59 | Admitting: Family Medicine

## 2021-05-26 ENCOUNTER — Encounter: Payer: Self-pay | Admitting: Family Medicine

## 2021-05-26 ENCOUNTER — Other Ambulatory Visit: Payer: Self-pay

## 2021-05-26 VITALS — BP 130/88 | HR 85 | Temp 97.9°F | Resp 16 | Ht 67.0 in | Wt 199.2 lb

## 2021-05-26 DIAGNOSIS — Z6831 Body mass index (BMI) 31.0-31.9, adult: Secondary | ICD-10-CM | POA: Diagnosis not present

## 2021-05-26 DIAGNOSIS — J309 Allergic rhinitis, unspecified: Secondary | ICD-10-CM

## 2021-05-26 DIAGNOSIS — J4540 Moderate persistent asthma, uncomplicated: Secondary | ICD-10-CM

## 2021-05-26 NOTE — Progress Notes (Addendum)
Established patient visit   Patient: Joy Garcia   DOB: November 17, 1986   35 y.o. Female  MRN: [id number] Visit Date: 05/26/2021  Today's healthcare provider: Mila Merry, MD   Chief Complaint  Patient presents with   Obesity   Asthma   Subjective    HPI  Follow up for obesity:  The patient was last seen for this 1  month  ago. Changes made at last visit include starting topiramate (TOPAMAX) 50 MG tablet; Take 1/2 tablet daily for 14 days, then increase to 1 tablet daily, and Phentermine 15mg  daily.  She reports good compliance with treatment. She feels that condition is Improved. States that initially she had almost no appetite but appetite is starting to come back.  She is having a little more dry mouth with new meds, but otherwise tolerating well.  Wt Readings from Last 4 Encounters:  05/26/21 199 lb 3.2 oz (90.4 kg)  04/12/21 211 lb 9.6 oz (96 kg)  07/15/20 190 lb 9.6 oz (86.5 kg)  05/20/20 190 lb (86.2 kg)     -----------------------------------------------------------------------------------------   Follow up for asthma:  The patient was last seen for this 1  month  ago. Changes made at last visit include increasing fluticasone-salmeterol (ADVAIR DISKUS) to 250-50 MCG/ACT AEPB; Inhale 1 puff into the lungs in the morning and at bedtime. Also added montelukast (SINGULAIR) 10 MG tablet; Take 1 tablet (10 mg total) by mouth at bedtime.  She reports good compliance with treatment. She feels that condition is Improved. Has not had to use rescue inhaler since starting maintenance inhaler and montelukast She is not having side effects.   -----------------------------------------------------------------------------------------      Medications: Outpatient Medications Prior to Visit  Medication Sig   albuterol (VENTOLIN HFA) 108 (90 Base) MCG/ACT inhaler Inhale 2 puffs into the lungs every 6 (six) hours as needed for wheezing or shortness of breath.    clonazePAM (KLONOPIN) 0.5 MG tablet Take 0.5 mg by mouth as needed for anxiety.   fluticasone-salmeterol (ADVAIR DISKUS) 250-50 MCG/ACT AEPB Inhale 1 puff into the lungs in the morning and at bedtime.   fluvoxaMINE (LUVOX) 100 MG tablet Take 1 tablet (100 mg total) by mouth at bedtime.   montelukast (SINGULAIR) 10 MG tablet Take 1 tablet (10 mg total) by mouth at bedtime.   Norethindrone Acetate-Ethinyl Estradiol (HAILEY 1.5/30) 1.5-30 MG-MCG tablet Take 1 tablet by mouth daily.   phentermine 15 MG capsule Take 1 capsule (15 mg total) by mouth every morning.   topiramate (TOPAMAX) 50 MG tablet Take 1/2 tablet daily for 14 days, then increase to 1 tablet daily   traZODone (DESYREL) 50 MG tablet TAKE 0.5-1 TABLETS BY MOUTH EVERY NIGHT AT BEDTIME AS NEEDED FOR SLEEP   [DISCONTINUED] fluticasone-salmeterol 100-50 MCG/ACT AEPB Inhale 1 puff into the lungs 2 (two) times daily.   No facility-administered medications prior to visit.    Review of Systems  Constitutional:  Negative for appetite change, chills, fatigue and fever.  Respiratory:  Negative for chest tightness and shortness of breath.   Cardiovascular:  Negative for chest pain and palpitations.  Gastrointestinal:  Negative for abdominal pain, nausea and vomiting.  Neurological:  Negative for dizziness and weakness.      Objective    BP 130/88 (BP Location: Left Arm, Patient Position: Sitting, Cuff Size: Normal)   Pulse 85   Temp 97.9 F (36.6 C) (Temporal)   Resp 16   Ht 5\' 7"  (1.702 m)   Wt 199 lb  3.2 oz (90.4 kg)   LMP 05/18/2021   BMI 31.20 kg/m     Physical Exam  General appearance:  Very pleasant  female, cooperative and in no acute distress Head: Normocephalic, without obvious abnormality, atraumatic Respiratory: Respirations even and unlabored, normal respiratory rate Psych: Appropriate mood and affect. Neurologic: Mental status: Alert, oriented to person, place, and time, thought content appropriate.      Assessment & Plan     1. Moderate persistent asthma without complication Much better since increasing dose of maintenance inhaler and addition of montelukast  2. Allergic rhinitis, unspecified seasonality, unspecified trigger Doing well with montelukast.   3. BMI 31.0-31.9,adult Doing well with initiation of phentermine and topiramate. 12 pound wt loss noted. Continue current medications.  She is starting to notice appetite coming back. Consider increasing topiramate at follow up if weight loss stalls.   Future Appointments  Date Time Provider Department Center  09/24/2021  4:00 PM Sherrie Mustache Demetrios Isaacs, MD [facility]        The entirety of the information documented in the History of Present Illness, Review of Systems and Physical Exam were personally obtained by me. Portions of this information were initially documented by the CMA and reviewed by me for thoroughness and accuracy.     Mila Merry, MD  [facility] [phone] (phone) [phone] (fax)  [facility]

## 2021-05-31 ENCOUNTER — Other Ambulatory Visit: Payer: Self-pay | Admitting: Family Medicine

## 2021-05-31 DIAGNOSIS — E669 Obesity, unspecified: Secondary | ICD-10-CM

## 2021-05-31 MED ORDER — TOPIRAMATE 50 MG PO TABS: 50.0000 mg | ORAL_TABLET | Freq: Every day | ORAL | 3 refills | Status: DC

## 2021-05-31 MED ORDER — PHENTERMINE HCL 15 MG PO CAPS: 15.0000 mg | ORAL_CAPSULE | ORAL | 3 refills | Status: DC

## 2021-07-05 ENCOUNTER — Other Ambulatory Visit: Payer: Self-pay | Admitting: Family Medicine

## 2021-07-05 DIAGNOSIS — J4540 Moderate persistent asthma, uncomplicated: Secondary | ICD-10-CM

## 2021-08-11 ENCOUNTER — Other Ambulatory Visit: Payer: Self-pay | Admitting: Family Medicine

## 2021-08-11 DIAGNOSIS — J309 Allergic rhinitis, unspecified: Secondary | ICD-10-CM

## 2021-08-11 DIAGNOSIS — J4540 Moderate persistent asthma, uncomplicated: Secondary | ICD-10-CM

## 2021-09-24 ENCOUNTER — Encounter: Payer: Self-pay | Admitting: Family Medicine

## 2021-09-24 ENCOUNTER — Ambulatory Visit: Payer: 59 | Admitting: Family Medicine

## 2021-09-24 ENCOUNTER — Other Ambulatory Visit: Payer: Self-pay

## 2021-09-24 DIAGNOSIS — F429 Obsessive-compulsive disorder, unspecified: Secondary | ICD-10-CM | POA: Diagnosis not present

## 2021-09-24 DIAGNOSIS — F419 Anxiety disorder, unspecified: Secondary | ICD-10-CM | POA: Diagnosis not present

## 2021-09-24 DIAGNOSIS — E669 Obesity, unspecified: Secondary | ICD-10-CM

## 2021-09-24 MED ORDER — FLUVOXAMINE MALEATE 100 MG PO TABS: 150.0000 mg | ORAL_TABLET | Freq: Every day | ORAL | 4 refills | Status: DC

## 2021-09-24 MED ORDER — PHENTERMINE HCL 15 MG PO CAPS: 15.0000 mg | ORAL_CAPSULE | ORAL | 5 refills | Status: DC

## 2021-09-24 MED ORDER — TOPIRAMATE 50 MG PO TABS: 50.0000 mg | ORAL_TABLET | Freq: Two times a day (BID) | ORAL | 5 refills | Status: DC

## 2021-09-24 NOTE — Progress Notes (Signed)
Established patient visit   Patient: Joy Garcia   DOB: Jan 17, 1986   35 y.o. Female  MRN: [id number] Visit Date: 09/24/2021  Today's healthcare provider: Mila Merry, MD   Chief Complaint  Patient presents with   Obesity    Subjective    HPI  Follow up for obesity:  The patient was last seen for this on 05/26/2021.  Changes made at last visit include none. Continue current medications. Consider increasing topiramate at follow up if weight loss stalls  She reports good compliance with treatment. She feels that condition is Improved. She is having side effects (dry mouth).   -----------------------------------------------------------------------------------------     Medications: Outpatient Medications Prior to Visit  Medication Sig   albuterol (VENTOLIN HFA) 108 (90 Base) MCG/ACT inhaler Inhale 2 puffs into the lungs every 6 (six) hours as needed for wheezing or shortness of breath.   clonazePAM (KLONOPIN) 0.5 MG tablet Take 0.5 mg by mouth as needed for anxiety.   fluticasone-salmeterol (ADVAIR) 250-50 MCG/ACT AEPB INHALE ONE PUFF BY MOUTH EVERY MORNING AND EVERY NIGHT AT BEDTIME   fluvoxaMINE (LUVOX) 100 MG tablet Take 1 tablet (100 mg total) by mouth at bedtime.   montelukast (SINGULAIR) 10 MG tablet TAKE ONE TABLET BY MOUTH EVERY NIGHT AT BEDTIME   Norethindrone Acetate-Ethinyl Estradiol (HAILEY 1.5/30) 1.5-30 MG-MCG tablet Take 1 tablet by mouth daily.   phentermine 15 MG capsule Take 1 capsule (15 mg total) by mouth every morning.   topiramate (TOPAMAX) 50 MG tablet Take 1 tablet (50 mg total) by mouth daily.   traZODone (DESYREL) 50 MG tablet TAKE 0.5-1 TABLETS BY MOUTH EVERY NIGHT AT BEDTIME AS NEEDED FOR SLEEP   No facility-administered medications prior to visit.    Review of Systems  Constitutional:  Negative for appetite change, chills, fatigue and fever.  Respiratory:  Negative for chest tightness and shortness of breath.   Cardiovascular:   Negative for chest pain and palpitations.  Gastrointestinal:  Negative for abdominal pain, nausea and vomiting.  Neurological:  Negative for dizziness and weakness.      Objective    BP 130/82 (BP Location: Left Arm, Patient Position: Sitting, Cuff Size: Normal)   Pulse 96   Temp 98.7 F (37.1 C) (Oral)   Resp 18   Ht 5\' 7"  (1.702 m)   Wt 196 lb 11.2 oz (89.2 kg)   LMP  (Within Weeks)   SpO2 100% Comment: room air  BMI 30.81 kg/m  {Show previous vital signs (optional):23777}  Physical Exam  General appearance: Overweight female, cooperative and in no acute distress Head: Normocephalic, without obvious abnormality, atraumatic Respiratory: Respirations even and unlabored, normal respiratory rate Extremities: All extremities are intact.  Skin: Skin color, texture, turgor normal. No rashes seen  Psych: Appropriate mood and affect. Neurologic: Mental status: Alert, oriented to person, place, and time, thought content appropriate.     Assessment & Plan     1. Class 1 obesity without serious comorbidity in adult, unspecified BMI, unspecified obesity type Doing well with phentermine and topiramate, but weight loss slowing. continue phentermine 15 MG capsule; Take 1 capsule (15 mg total) by mouth every morning.  Dispense: 30 capsule; Refill: 5  increase topiramate (TOPAMAX) 50 MG tablet; Take 1 tablet (50 mg total) from one a day to 2 (two) times daily.  Dispense: 60 tablet; Refill: 5  2. Anxiety She has been having more stress along with some depression the last few months. States that in the past she did  well with higher dose of fluvoxaMINE (LUVOX). Will increase to take 1.5 tablets (150 mg total) by mouth at bedtime.  Dispense: 135 tablet; Refill: 4  3. Obsessive-compulsive disorder, unspecified type  - fluvoxaMINE (LUVOX) 100 MG tablet; Take 1.5 tablets (150 mg total) by mouth at bedtime.  Dispense: 135 tablet; Refill: 4   Future Appointments  Date Time Provider Department  Center  03/22/2022  4:00 PM Sherrie Mustache, Demetrios Isaacs, MD [facility]        The entirety of the information documented in the History of Present Illness, Review of Systems and Physical Exam were personally obtained by me. Portions of this information were initially documented by the CMA and reviewed by me for thoroughness and accuracy.     Mila Merry, MD  [facility] [phone] (phone) [phone] (fax)  [facility]

## 2021-09-27 ENCOUNTER — Other Ambulatory Visit: Payer: Self-pay | Admitting: Family Medicine

## 2021-09-27 DIAGNOSIS — G4700 Insomnia, unspecified: Secondary | ICD-10-CM

## 2021-09-27 NOTE — Telephone Encounter (Signed)
[facility] refill request for the following medications:  traZODone (DESYREL) 50 MG tablet  Last Rx:10/26/20 LOV: 09/24/21 Please advise. Thanks TNP

## 2021-09-28 MED ORDER — TRAZODONE HCL 50 MG PO TABS: ORAL_TABLET | ORAL | 5 refills | Status: DC

## 2021-10-12 ENCOUNTER — Other Ambulatory Visit: Payer: Self-pay | Admitting: Family Medicine

## 2021-10-12 DIAGNOSIS — J4540 Moderate persistent asthma, uncomplicated: Secondary | ICD-10-CM

## 2021-10-12 NOTE — Telephone Encounter (Signed)
Requested Prescriptions  Pending Prescriptions Disp Refills  . fluticasone-salmeterol (ADVAIR) 250-50 MCG/ACT AEPB [Pharmacy Med Name: FLUTICASONE-SALMETEROL 250-50] 60 each 2    Sig: INHALE ONE PUFF BY MOUTH EVERY MORNING AND INHALE ONE PUFF BY MOUTH EVERY NIGHT AT BEDTIME     Pulmonology:  Combination Products Passed - 10/12/2021 10:18 AM      Passed - Valid encounter within last 12 months    Recent Outpatient Visits          2 weeks ago Class 1 obesity without serious comorbidity in adult, unspecified BMI, unspecified obesity type   [facility] Malva Limes, MD   4 months ago Moderate persistent asthma without complication   [facility] Malva Limes, MD   6 months ago Allergic rhinitis, unspecified seasonality, unspecified trigger   [facility] Malva Limes, MD   1 year ago Bug bite, initial encounter   [facility] [place], [place], [place]   1 year ago Mild intermittent asthma, unspecified whether complicated   [facility] [name], [place]      Future Appointments            In 5 months Fisher, Demetrios Isaacs, MD [facility]

## 2021-12-01 ENCOUNTER — Other Ambulatory Visit: Payer: Self-pay | Admitting: Family Medicine

## 2021-12-01 DIAGNOSIS — J4540 Moderate persistent asthma, uncomplicated: Secondary | ICD-10-CM

## 2021-12-01 DIAGNOSIS — J309 Allergic rhinitis, unspecified: Secondary | ICD-10-CM

## 2021-12-01 NOTE — Telephone Encounter (Signed)
Requested Prescriptions  Pending Prescriptions Disp Refills   montelukast (SINGULAIR) 10 MG tablet [Pharmacy Med Name: MONTELUKAST SOD 10 MG TABLET] 30 tablet 3    Sig: TAKE ONE TABLET BY MOUTH EVERY NIGHT AT BEDTIME     Pulmonology:  Leukotriene Inhibitors Passed - 12/01/2021  5:13 PM      Passed - Valid encounter within last 12 months    Recent Outpatient Visits          2 months ago Class 1 obesity without serious comorbidity in adult, unspecified BMI, unspecified obesity type   [facility] Malva Limes, MD   6 months ago Moderate persistent asthma without complication   [facility] Malva Limes, MD   7 months ago Allergic rhinitis, unspecified seasonality, unspecified trigger   [facility] Malva Limes, MD   1 year ago Bug bite, initial encounter   [facility] [place], [place], [place]   1 year ago Mild intermittent asthma, unspecified whether complicated   [facility] [name], PA-C      Future Appointments            In 3 months Fisher, Demetrios Isaacs, MD [facility], [facility]

## 2022-01-16 ENCOUNTER — Other Ambulatory Visit: Payer: Self-pay | Admitting: Family Medicine

## 2022-01-16 DIAGNOSIS — J4540 Moderate persistent asthma, uncomplicated: Secondary | ICD-10-CM

## 2022-02-01 ENCOUNTER — Other Ambulatory Visit: Payer: Self-pay | Admitting: Family Medicine

## 2022-02-01 DIAGNOSIS — Z3041 Encounter for surveillance of contraceptive pills: Secondary | ICD-10-CM

## 2022-02-01 NOTE — Telephone Encounter (Signed)
[facility] faxed refill request for the following medications:  Norethindrone Acetate-Ethinyl Estradiol (HAILEY 1.5/30) 1.5-30 MG-MCG tablet   Please advise.

## 2022-02-02 MED ORDER — NORETHINDRONE ACET-ETHINYL EST 1.5-30 MG-MCG PO TABS: 1.0000 | ORAL_TABLET | Freq: Every day | ORAL | 3 refills | Status: DC

## 2022-02-02 NOTE — Telephone Encounter (Signed)
Please advise on refill request. When I tried to approve medication refill, I received a message box that states Topiramate may reduce effectiveness of birthcontrol. Please advise if ok to send in refill.

## 2022-03-11 ENCOUNTER — Other Ambulatory Visit: Payer: Self-pay | Admitting: Family Medicine

## 2022-03-11 DIAGNOSIS — S161XXA Strain of muscle, fascia and tendon at neck level, initial encounter: Secondary | ICD-10-CM

## 2022-03-16 ENCOUNTER — Other Ambulatory Visit: Payer: Self-pay | Admitting: Family

## 2022-03-16 DIAGNOSIS — S161XXA Strain of muscle, fascia and tendon at neck level, initial encounter: Secondary | ICD-10-CM

## 2022-03-21 ENCOUNTER — Encounter: Payer: Self-pay | Admitting: Family Medicine

## 2022-03-21 ENCOUNTER — Ambulatory Visit
Admission: RE | Admit: 2022-03-21 | Discharge: 2022-03-21 | Disposition: A | Discharge status: Home / Self Care | Payer: 59 | Source: Ambulatory Visit | Attending: Family Medicine | Admitting: Family Medicine

## 2022-03-21 DIAGNOSIS — S161XXA Strain of muscle, fascia and tendon at neck level, initial encounter: Secondary | ICD-10-CM | POA: Insufficient documentation

## 2022-03-22 ENCOUNTER — Ambulatory Visit: Payer: 59 | Admitting: Family Medicine

## 2022-03-23 ENCOUNTER — Encounter: Payer: Self-pay | Admitting: Physician Assistant

## 2022-03-23 ENCOUNTER — Ambulatory Visit: Payer: 59 | Admitting: Physician Assistant

## 2022-03-23 VITALS — BP 130/85 | HR 85 | Temp 98.4°F | Resp 16 | Wt 206.6 lb

## 2022-03-23 DIAGNOSIS — R52 Pain, unspecified: Secondary | ICD-10-CM | POA: Diagnosis not present

## 2022-03-23 MED ORDER — GABAPENTIN 600 MG PO TABS: 600.0000 mg | ORAL_TABLET | Freq: Three times a day (TID) | ORAL | 0 refills | Status: DC

## 2022-03-23 MED ORDER — KETOROLAC TROMETHAMINE 60 MG/2ML IM SOLN
60.0000 mg | Freq: Once | INTRAMUSCULAR | Status: AC
  Administered 2022-03-23: 60 mg via INTRAMUSCULAR

## 2022-03-23 MED ORDER — KETOROLAC TROMETHAMINE 30 MG/ML IJ SOLN: 30.0000 mg | Freq: Once | INTRAMUSCULAR | Status: DC

## 2022-03-23 MED ORDER — NAPROXEN SODIUM 550 MG PO TABS: 550.0000 mg | ORAL_TABLET | Freq: Two times a day (BID) | ORAL | 0 refills | Status: AC

## 2022-03-23 MED ORDER — CYCLOBENZAPRINE HCL 10 MG PO TABS: 10.0000 mg | ORAL_TABLET | Freq: Three times a day (TID) | ORAL | 0 refills | Status: DC | PRN

## 2022-03-23 NOTE — Progress Notes (Signed)
? ?I,Tamikia Chowning Robinson,acting as a Neurosurgeon for [employer], PA-C.,have documented all relevant documentation on the behalf of Debera Lat, PA-C,as directed by  [employer], PA-C while in the presence of [employer], PA-C. ? ? ?Established patient visit ? ? ?Patient: Joy Garcia   DOB: [birth year]   36 y.o. Female  MRN: [id number] ?Visit Date: 03/23/2022 ? ?Today's healthcare provider: Debera Lat, PA-C  ?CC: Neck pain with radiating pain into shoulder and left arm ? ?Subjective  ?  ?HPI ?Patient is here today to go over results from MRI on Monday.  Patient has reviewed on MyChart.  MRI was ordered after being seen at the [facility] for neck sprain on March 28.  ?MRI of cervical spine showed: ?      At C5-C6, there is mild-to-moderate disc degeneration. Shallow disc ?     bulge. Mild uncovertebral hypertrophy on the left. Mild partial ?     effacement of the ventral thecal sac (without spinal cord mass ?     effect). No significant foraminal stenosis. ?     At C6-C7, there is mild disc degeneration. Shallow disc bulge. Mild ?     partial effacement of the ventral thecal sac (without spinal cord ?     mass effect). No significant foraminal stenosis. ?     Nonspecific reversal of the expected cervical lordosis. ? ?There is no chart of the visit available for review.  Patient's reports she is currently taking naproxen and Flexeril for pain control with some relief . This particular day patient left work due to base of neck pain and into shoulder and left arm.  Reports it hurt to breath / sharp, shooting, radiating pain.  Patient rested for remainder of day with heating pad and took Advil and Tylenol for pain. Since then, continued back of neck and left shoulder pain with weakness down left arm.  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 At the end of day, she suffers from severe pain. She has a trouble to sleep due to pain.  ?Reports pain in shoulders/mostly left for years. ?Reports low back pain with radiation to the left  leg. ?Denies having injury or trauma ?---------------------------------------------------------------------------------------------------  ?Medications: ?Outpatient Medications Prior to Visit  ?Medication Sig  ? albuterol (VENTOLIN HFA) 108 (90 Base) MCG/ACT inhaler Inhale 2 puffs into the lungs every 6 (six) hours as needed for wheezing or shortness of breath.  ? clonazePAM (KLONOPIN) 0.5 MG tablet Take 0.5 mg by mouth as needed for anxiety.  ? fluticasone-salmeterol (ADVAIR) 250-50 MCG/ACT AEPB INHALE ONE PUFF BY MOUTH EVERY MORNING AND INHALE ONE PUFF BY MOUTH EVERY NIGHT AT BEDTIME  ? fluvoxaMINE (LUVOX) 100 MG tablet Take 1.5 tablets (150 mg total) by mouth at bedtime.  ? montelukast (SINGULAIR) 10 MG tablet TAKE ONE TABLET BY MOUTH EVERY NIGHT AT BEDTIME  ? Norethindrone Acetate-Ethinyl Estradiol (HAILEY 1.5/30) 1.5-30 MG-MCG tablet Take 1 tablet by mouth daily.  ? phentermine 15 MG capsule Take 1 capsule (15 mg total) by mouth every morning.  ? topiramate (TOPAMAX) 50 MG tablet Take 1 tablet (50 mg total) by mouth 2 (two) times daily.  ? traZODone (DESYREL) 50 MG tablet TAKE 0.5-1 TABLETS BY MOUTH EVERY NIGHT AT BEDTIME AS NEEDED FOR SLEEP  ? ?No facility-administered medications prior to visit.  ? ? ?Review of Systems  ?Constitutional:  Positive for fatigue. Negative for activity change and fever.  ?Cardiovascular: Negative.   ?Gastrointestinal: Negative.   ?Musculoskeletal:  Positive for neck pain.  ?Neurological:  Positive for  weakness. Negative for facial asymmetry.  ? ?  Objective  ?  ?BP 130/85 (BP Location: Right Arm, Patient Position: Sitting, Cuff Size: Normal)   Pulse 85   Temp 98.4 ?F (36.9 ?C) (Oral)   Resp 16   Wt 206 lb 9.6 oz (93.7 kg)   SpO2 99%   BMI 32.36 kg/m?  ? ? ?Physical Exam ?Vitals and nursing note reviewed.  ?Constitutional:   ?   General: She is in acute distress.  ?   Appearance: Normal appearance.  ?HENT:  ?   Head: Normocephalic and atraumatic.  ?Eyes:  ?   Extraocular  Movements: Extraocular movements intact.  ?   Pupils: Pupils are equal, round, and reactive to light.  ?Cardiovascular:  ?   Rate and Rhythm: Normal rate.  ?   Pulses: Normal pulses.  ?Pulmonary:  ?   Effort: Pulmonary effort is normal.  ?Musculoskeletal:     ?   General: Tenderness present. No swelling, deformity or signs of injury.  ?   Cervical back: Tenderness present.  ?Skin: ?   Findings: No erythema or lesion.  ?Neurological:  ?   Mental Status: She is alert and oriented to person, place, and time.  ?Psychiatric:     ?   Behavior: Behavior normal.     ?   Thought Content: Thought content normal.     ?   Judgment: Judgment normal.  ?  ? ?No results found for any visits on 03/23/22. ? Assessment & Plan  ?  ? ?1. Pain ?Due to neck strain vs compression of nerve roots vs degenerative disc disease vs uncovertebral hypertrophy on the left vs cervical radiculopathy ?- naproxen sodium (ANAPROX) 550 MG tablet; Take 1 tablet (550 mg total) by mouth 2 (two) times daily with a meal.  Dispense: 60 tablet; Refill: 0 ?- cyclobenzaprine (FLEXERIL) 10 MG tablet; Take 1 tablet (10 mg total) by mouth 3 (three) times daily as needed for muscle spasms.  Dispense: 30 tablet; Refill: 0 ?- gabapentin (NEURONTIN) 600 MG tablet; Take 1 tablet (600 mg total) by mouth 3 (three) times daily.  Dispense: 90 tablet; Refill: 0 ?- Ambulatory referral to Physical Therapy ?- Ambulatory referral to Orthopedic Surgery, scheduled with [facility] for evaluation ?- ketorolac (TORADOL) injection 60 mg  ?- Weight loss will be beneficial after acute pain will subside. ? ?  FU as needed ?The patient was advised to call back or seek an in-person evaluation if the symptoms worsen or if the condition fails to improve as anticipated. ? ?I discussed the assessment and treatment plan with the patient. The patient was provided an opportunity to ask questions and all were answered. The patient agreed with the plan and demonstrated an understanding of the  instructions. ? ?The entirety of the information documented in the History of Present Illness, Review of Systems and Physical Exam were personally obtained by me. Portions of this information were initially documented by the CMA and reviewed by me for thoroughness and accuracy.   ? ?Debera Lat, PA-C  ?[facility] ?[phone] (phone) ?[phone] (fax) ? ?[facility] ?

## 2022-03-28 ENCOUNTER — Other Ambulatory Visit: Payer: Self-pay | Admitting: Family Medicine

## 2022-03-28 ENCOUNTER — Encounter: Payer: Self-pay | Admitting: Family Medicine

## 2022-03-28 ENCOUNTER — Ambulatory Visit: Payer: 59 | Admitting: Family Medicine

## 2022-03-28 VITALS — BP 131/87 | HR 86 | Temp 98.9°F | Resp 14 | Wt 207.0 lb

## 2022-03-28 DIAGNOSIS — F419 Anxiety disorder, unspecified: Secondary | ICD-10-CM | POA: Diagnosis not present

## 2022-03-28 DIAGNOSIS — J4540 Moderate persistent asthma, uncomplicated: Secondary | ICD-10-CM

## 2022-03-28 DIAGNOSIS — F429 Obsessive-compulsive disorder, unspecified: Secondary | ICD-10-CM

## 2022-03-28 DIAGNOSIS — E669 Obesity, unspecified: Secondary | ICD-10-CM | POA: Diagnosis not present

## 2022-03-28 DIAGNOSIS — G4700 Insomnia, unspecified: Secondary | ICD-10-CM

## 2022-03-28 DIAGNOSIS — M5030 Other cervical disc degeneration, unspecified cervical region: Secondary | ICD-10-CM

## 2022-03-28 MED ORDER — ALBUTEROL SULFATE HFA 108 (90 BASE) MCG/ACT IN AERS: 2.0000 | INHALATION_SPRAY | Freq: Four times a day (QID) | RESPIRATORY_TRACT | 2 refills | Status: AC | PRN

## 2022-03-28 MED ORDER — PHENTERMINE HCL 15 MG PO CAPS: 15.0000 mg | ORAL_CAPSULE | ORAL | 5 refills | Status: DC

## 2022-03-28 NOTE — Patient Instructions (Signed)
.   Please review the attached list of medications and notify my office if there are any errors.   . Please bring all of your medications to every appointment so we can make sure that our medication list is the same as yours.   

## 2022-03-28 NOTE — Progress Notes (Signed)
?  ? ?I,Roshena L Chambers,acting as a scribe for Mila Merry, MD.,have documented all relevant documentation on the behalf of Mila Merry, MD,as directed by  Mila Merry, MD while in the presence of Mila Merry, MD.  ? ? ?Established patient visit ? ? ?Patient: Joy Garcia   DOB: 04-Jan-1986   36 y.o. Female  MRN: [id number] ?Visit Date: 03/28/2022 ? ?Today's healthcare provider: Mila Merry, MD  ? ?Chief Complaint  ?Patient presents with  ? Anxiety  ? Obesity  ? ?Subjective  ?  ?HPI  ?Anxiety, Follow-up ? ?She was last seen for anxiety 6 months ago. ?Changes made at last visit include: Increase Fluvoxamine to take 1.5 tablets by mouth at bedtime.  ?She reports good compliance with treatment. ?She reports good tolerance of treatment. ?She is not having side effects.  ? ?She feels her anxiety is moderate and Improved since last visit. ? ?Symptoms: ?No chest pain No difficulty concentrating  ?No dizziness No fatigue  ?No feelings of losing control No insomnia  ?No irritable No palpitations  ?No panic attacks No racing thoughts  ?No shortness of breath No sweating  ?No tremors/shakes   ? ?GAD-7 Results ?   ? View : No data to display.  ?  ?  ?  ? ? ?PHQ-9 Scores ? ?  09/24/2021  ?  4:06 PM 04/12/2021  ? 10:10 AM  ?PHQ9 SCORE ONLY  ?PHQ-9 Total Score 6 4  ? ? ?---------------------------------------------------------------------------------------------------  ?Follow up for obesity  ? ?The patient was last seen for this 6 months ago. ?Changes made at last visit include continue phentermine 15 MG capsule; Take 1 capsule (15 mg total) by mouth every morning.  ?Increase topiramate (TOPAMAX) 50 MG tablet; Take 1 tablet (50 mg total) from one a day to 2 (two) times daily.She reports fair compliance with treatment. ?She feels that condition is Worse. Patient has not been able to exercise due to ankle  pain related to injury.  ?She is not having side effects.   ? ?-----------------------------------------------------------------------------------------  ? ?Pertinent labs ?Lab Results  ?Component Value Date  ? CHOL 175 09/11/2019  ? HDL 52 09/11/2019  ? LDLCALC 112 (H) 09/11/2019  ? TRIG 54 09/11/2019  ? CHOLHDL 3.4 09/11/2019  ? Lab Results  ?Component Value Date  ? NA 138 07/15/2020  ? K 4.6 07/15/2020  ? CREATININE 0.73 07/15/2020  ? GFRNONAA 109 07/15/2020  ? GLUCOSE 99 07/15/2020  ? TSH 0.748 07/15/2020  ?  ? ?The ASCVD Risk score (Arnett DK, et al., 2019) failed to calculate for the following reasons: ?  The 2019 ASCVD risk score is only valid for ages 28 to 84 ? ?---------------------------------------------------------------------------------------------------  ? ?Medications: ?Outpatient Medications Prior to Visit  ?Medication Sig  ? clonazePAM (KLONOPIN) 0.5 MG tablet Take 0.5 mg by mouth as needed for anxiety.  ? cyclobenzaprine (FLEXERIL) 10 MG tablet Take 1 tablet (10 mg total) by mouth 3 (three) times daily as needed for muscle spasms.  ? fluticasone-salmeterol (ADVAIR) 250-50 MCG/ACT AEPB INHALE ONE PUFF BY MOUTH EVERY MORNING AND INHALE ONE PUFF BY MOUTH EVERY NIGHT AT BEDTIME  ? fluvoxaMINE (LUVOX) 100 MG tablet Take 1.5 tablets (150 mg total) by mouth at bedtime.  ? gabapentin (NEURONTIN) 600 MG tablet Take 1 tablet (600 mg total) by mouth 3 (three) times daily. (Patient taking differently: Take 150 mg by mouth 3 (three) times daily.)  ? montelukast (SINGULAIR) 10 MG tablet TAKE ONE TABLET BY MOUTH EVERY NIGHT AT BEDTIME  ? naproxen  sodium (ANAPROX) 550 MG tablet Take 1 tablet (550 mg total) by mouth 2 (two) times daily with a meal.  ? Norethindrone Acetate-Ethinyl Estradiol (HAILEY 1.5/30) 1.5-30 MG-MCG tablet Take 1 tablet by mouth daily.  ? topiramate (TOPAMAX) 50 MG tablet Take 1 tablet (50 mg total) by mouth 2 (two) times daily.  ? traZODone (DESYREL) 50 MG tablet TAKE 1/2 TO 1 TABLET BY MOUTH EVERY NIGHT AT BEDTIME AS NEEDED FOR SLEEP  ? [albuterol  (VENTOLIN HFA) 108 (90 Base) MCG/ACT inhaler Inhale 2 puffs into the lungs every 6 (six) hours as needed for wheezing or shortness of breath.  ? phentermine 15 MG capsule Take 1 capsule (15 mg total) by mouth every morning.  ? ?No facility-administered medications prior to visit.  ? ? ?Review of Systems  ?Constitutional:  Negative for appetite change, chills, fatigue and fever.  ?Respiratory:  Negative for chest tightness and shortness of breath.   ?Cardiovascular:  Negative for chest pain and palpitations.  ?Gastrointestinal:  Negative for abdominal pain, nausea and vomiting.  ?Musculoskeletal:  Positive for arthralgias (right ankle pain) and neck pain.  ?Neurological:  Negative for dizziness and weakness.  ? ? ?  Objective  ?  ?BP 131/87 (BP Location: Right Arm, Patient Position: Sitting, Cuff Size: Large)   Pulse 86   Temp 98.9 ?F (37.2 ?C) (Oral)   Resp 14   Wt 207 lb (93.9 kg)   SpO2 100% Comment: room air  BMI 32.42 kg/m?  ? ? ?Physical Exam  ? ?General appearance: Mildly obese female, cooperative and in no acute distress ?Head: Normocephalic, without obvious abnormality, atraumatic ?Respiratory: Respirations even and unlabored, normal respiratory rate ?Extremities: All extremities are intact.  ?Skin: Skin color, texture, turgor normal. No rashes seen  ?Psych: Appropriate mood and affect. ?Neurologic: Mental status: Alert, oriented to person, place, and time, thought content appropriate.  ? ? Assessment & Plan  ?  ? ?1. Anxiety ? ? ?2. Obsessive-compulsive disorder, unspecified type ?Doing very well, improved since increasing fluvoxamine to 150mg .  ? ?3. Class 1 obesity without serious comorbidity in adult, unspecified BMI, unspecified obesity type ?She was doing very well with topiramate and phentermine before she injured her ankles an had physically limiting pains from DDD of neck.  ? ?Refill phentermine 15 MG capsule; Take 1 capsule (15 mg total) by mouth every morning.  Dispense: 30 capsule; Refill:  5 ? ?4. Moderate persistent asthma without complication ?Rarely needs albuterol since starting on maintenance inhaler, but is due for refill of - albuterol (VENTOLIN HFA) 108 (90 Base) MCG/ACT inhaler; Inhale 2 puffs into the lungs every 6 (six) hours as needed for wheezing or shortness of breath.  Dispense: 18 g; Refill: 2 ? ?5. DDD (degenerative disc disease), cervical ?Was seen by [facility] and referred to rheumatology which is pending. She does feel gabapentin has helped neuropathic pain considerably.  ? ?Future Appointments  ?Date Time Provider Department Center  ?09/30/2022  4:00 PM Amayrani Bennick, 10/02/2022, MD [facility]  ?  ?   ? ?The entirety of the information documented in the History of Present Illness, Review of Systems and Physical Exam were personally obtained by me. Portions of this information were initially documented by the CMA and reviewed by me for thoroughness and accuracy.   ? ? ?Demetrios Isaacs, MD  ?[facility] ?[phone] (phone) ?[phone] (fax) ? ?[facility] ?

## 2022-04-02 ENCOUNTER — Other Ambulatory Visit: Payer: Self-pay | Admitting: Family Medicine

## 2022-04-02 DIAGNOSIS — J309 Allergic rhinitis, unspecified: Secondary | ICD-10-CM

## 2022-04-02 DIAGNOSIS — J4540 Moderate persistent asthma, uncomplicated: Secondary | ICD-10-CM

## 2022-04-12 ENCOUNTER — Other Ambulatory Visit: Payer: Self-pay | Admitting: Family Medicine

## 2022-04-12 DIAGNOSIS — E669 Obesity, unspecified: Secondary | ICD-10-CM

## 2022-04-18 ENCOUNTER — Other Ambulatory Visit: Payer: Self-pay | Admitting: Physician Assistant

## 2022-04-18 DIAGNOSIS — R52 Pain, unspecified: Secondary | ICD-10-CM

## 2022-05-03 LAB — VITAMIN B12: Vitamin B-12: 193

## 2022-05-10 ENCOUNTER — Telehealth: Payer: Self-pay

## 2022-05-10 DIAGNOSIS — E538 Deficiency of other specified B group vitamins: Secondary | ICD-10-CM | POA: Insufficient documentation

## 2022-05-10 NOTE — Telephone Encounter (Signed)
Please advise 

## 2022-05-10 NOTE — Progress Notes (Unsigned)
      I,Sha'taria Tyson,acting as a Education administrator for [employer], PA-C.,have documented all relevant documentation on the behalf of Mikey Kirschner, PA-C,as directed by  Mikey Kirschner, PA-C while in the presence of Mikey Kirschner, PA-C.  Established patient visit   Patient: Joy Garcia   DOB: 01-08-1986   36 y.o. Female  MRN: [id number] Visit Date: 05/11/2022  Today's healthcare provider: Mikey Kirschner, PA-C   No chief complaint on file.  Subjective    HPI  Patient is in today to  discuss low B-12 and would like to begin receiving injections.  Medications: Outpatient Medications Prior to Visit  Medication Sig   albuterol (VENTOLIN HFA) 108 (90 Base) MCG/ACT inhaler Inhale 2 puffs into the lungs every 6 (six) hours as needed for wheezing or shortness of breath.   clonazePAM (KLONOPIN) 0.5 MG tablet Take 0.5 mg by mouth as needed for anxiety.   cyclobenzaprine (FLEXERIL) 10 MG tablet Take 1 tablet (10 mg total) by mouth 3 (three) times daily as needed for muscle spasms.   fluticasone-salmeterol (ADVAIR) 250-50 MCG/ACT AEPB INHALE ONE PUFF BY MOUTH EVERY MORNING AND INHALE ONE PUFF BY MOUTH EVERY NIGHT AT BEDTIME   fluvoxaMINE (LUVOX) 100 MG tablet Take 1.5 tablets (150 mg total) by mouth at bedtime.   gabapentin (NEURONTIN) 600 MG tablet TAKE ONE TABLET BY MOUTH THREE TIMES A DAY   montelukast (SINGULAIR) 10 MG tablet TAKE ONE TABLET BY MOUTH EVERY NIGHT AT BEDTIME   naproxen sodium (ANAPROX) 550 MG tablet Take 1 tablet (550 mg total) by mouth 2 (two) times daily with a meal.   Norethindrone Acetate-Ethinyl Estradiol (HAILEY 1.5/30) 1.5-30 MG-MCG tablet Take 1 tablet by mouth daily.   phentermine 15 MG capsule Take 1 capsule (15 mg total) by mouth every morning.   topiramate (TOPAMAX) 50 MG tablet TAKE ONE TABLET BY MOUTH TWICE A DAY   traZODone (DESYREL) 50 MG tablet TAKE 1/2 TO 1 TABLET BY MOUTH EVERY NIGHT AT BEDTIME AS NEEDED FOR SLEEP   No facility-administered medications  prior to visit.    Review of Systems  {Labs  Heme  Chem  Endocrine  Serology  Results Review (optional):23779}   Objective    There were no vitals taken for this visit. {Show previous vital signs (optional):23777}  Physical Exam  ***  No results found for any visits on 05/11/22.  Assessment & Plan     ***  No follow-ups on file.      {provider attestation***:1}   Mikey Kirschner, PA-C  [facility] [phone] (phone) [phone] (fax)  [place]

## 2022-05-10 NOTE — Telephone Encounter (Signed)
Copied from [place] [id number]. Topic: General - Other >> May 10, 2022 10:55 AM Caprice Red wrote: Reason for CRM: Saw Dessor at [facility]. B12 [id number] and they recommended that she start to get B12 injections and be followed by PCP. Please follow-up with patient to let her know what the next steps are.

## 2022-05-10 NOTE — Telephone Encounter (Signed)
That's fine, if she wants to do shot then start with 1 mg injections in the office once a week x 4 weeks. Alternatively, she could try OTC B12 1mg  every day. Either way need follow up office visit in 1 month.

## 2022-05-11 ENCOUNTER — Ambulatory Visit: Payer: 59 | Admitting: Physician Assistant

## 2022-05-11 ENCOUNTER — Encounter: Payer: Self-pay | Admitting: Physician Assistant

## 2022-05-11 VITALS — BP 131/70 | HR 90 | Ht 68.0 in | Wt 209.5 lb

## 2022-05-11 DIAGNOSIS — E538 Deficiency of other specified B group vitamins: Secondary | ICD-10-CM | POA: Diagnosis not present

## 2022-05-11 MED ORDER — CYANOCOBALAMIN 1000 MCG/ML IJ SOLN
1000.0000 ug | Freq: Once | INTRAMUSCULAR | Status: AC
  Administered 2022-05-11: 1000 ug via INTRAMUSCULAR

## 2022-05-11 NOTE — Assessment & Plan Note (Addendum)
Recent labs from rheum, level was 197 05/03/22 Advised we can replace IM or orally Pt concerned she may have malabsorption issue ; advised we can proceed with injections of 1000 mg weekly x 4 weeks and then recheck level Consider oral replacement and monitoring after that to see if her levels drop again in the future  --gave injection #1 today

## 2022-05-18 ENCOUNTER — Ambulatory Visit (INDEPENDENT_AMBULATORY_CARE_PROVIDER_SITE_OTHER): Payer: 59

## 2022-05-18 DIAGNOSIS — E538 Deficiency of other specified B group vitamins: Secondary | ICD-10-CM | POA: Diagnosis not present

## 2022-05-18 MED ORDER — CYANOCOBALAMIN 1000 MCG/ML IJ SOLN
1000.0000 ug | Freq: Once | INTRAMUSCULAR | Status: AC
  Administered 2022-05-18: 1000 ug via INTRAMUSCULAR

## 2022-05-19 ENCOUNTER — Other Ambulatory Visit: Payer: Self-pay | Admitting: Family Medicine

## 2022-05-19 DIAGNOSIS — R52 Pain, unspecified: Secondary | ICD-10-CM

## 2022-05-19 MED ORDER — GABAPENTIN 600 MG PO TABS
600.0000 mg | ORAL_TABLET | Freq: Three times a day (TID) | ORAL | 0 refills | Status: DC
Start: 2022-05-19 — End: 2022-06-20

## 2022-05-19 NOTE — Telephone Encounter (Signed)
Recent Cr was WNL/in May of 2023

## 2022-05-19 NOTE — Telephone Encounter (Signed)
Prescription was prescribed by Mardene Speak, PA-C on 03/23/2022 #90 with 0 refills. Medication was refilled by [place] on 04/18/2022 for #90 with 0 refills.   Please advise on refill request.

## 2022-05-19 NOTE — Telephone Encounter (Signed)
[facility] faxed refill request for the following medications:   gabapentin (NEURONTIN) 600 MG tablet    Please advise.  

## 2022-05-25 ENCOUNTER — Ambulatory Visit (INDEPENDENT_AMBULATORY_CARE_PROVIDER_SITE_OTHER): Payer: 59

## 2022-05-25 DIAGNOSIS — E538 Deficiency of other specified B group vitamins: Secondary | ICD-10-CM | POA: Diagnosis not present

## 2022-05-25 MED ORDER — CYANOCOBALAMIN 1000 MCG/ML IJ SOLN
1000.0000 ug | Freq: Once | INTRAMUSCULAR | Status: AC
  Administered 2022-05-25: 1000 ug via INTRAMUSCULAR

## 2022-06-01 ENCOUNTER — Ambulatory Visit (INDEPENDENT_AMBULATORY_CARE_PROVIDER_SITE_OTHER): Payer: 59

## 2022-06-01 DIAGNOSIS — E538 Deficiency of other specified B group vitamins: Secondary | ICD-10-CM

## 2022-06-01 MED ORDER — CYANOCOBALAMIN 1000 MCG/ML IJ SOLN
1000.0000 ug | Freq: Once | INTRAMUSCULAR | Status: AC
  Administered 2022-06-01: 1000 ug via INTRAMUSCULAR

## 2022-06-09 LAB — VITAMIN B12: Vitamin B-12: 1278 pg/mL — ABNORMAL HIGH (ref 232–1245)

## 2022-06-18 ENCOUNTER — Other Ambulatory Visit: Payer: Self-pay | Admitting: Physician Assistant

## 2022-06-18 DIAGNOSIS — R52 Pain, unspecified: Secondary | ICD-10-CM

## 2022-06-20 NOTE — Telephone Encounter (Signed)
Requested medication (s) are due for refill today: yes  Requested medication (s) are on the active medication list: yes  Last refill:  05/19/22 # 90  Future visit scheduled: yes  Notes to clinic:  overdue Cr level (07/15/20)   Requested Prescriptions  Pending Prescriptions Disp Refills   gabapentin (NEURONTIN) 600 MG tablet [Pharmacy Med Name: GABAPENTIN 600 MG TABLET] 90 tablet 0    Sig: TAKE ONE TABLET BY MOUTH THREE TIMES A DAY     Neurology: Anticonvulsants - gabapentin Failed - 06/18/2022  6:53 AM      Failed - Cr in normal range and within 360 days    Creatinine, Ser  Date Value Ref Range Status  07/15/2020 0.73 0.57 - 1.00 mg/dL Final         Passed - Completed PHQ-2 or PHQ-9 in the last 360 days      Passed - Valid encounter within last 12 months    Recent Outpatient Visits           1 month ago Vitamin B12 deficiency   [facility] [place], [place], PA-C   2 months ago Anxiety   [facility] Malva Limes, MD   2 months ago Pain   [facility] [place], [place], PA-C   8 months ago Class 1 obesity without serious comorbidity in adult, unspecified BMI, unspecified obesity type   [facility] Malva Limes, MD   1 year ago Moderate persistent asthma without complication   [facility] Malva Limes, MD       Future Appointments             In 3 months Fisher, Demetrios Isaacs, MD [facility]

## 2022-08-03 ENCOUNTER — Encounter: Payer: Self-pay | Admitting: Family Medicine

## 2022-08-03 DIAGNOSIS — E538 Deficiency of other specified B group vitamins: Secondary | ICD-10-CM

## 2022-08-10 LAB — VITAMIN B12: Vitamin B-12: 805 pg/mL (ref 232–1245)

## 2022-09-12 ENCOUNTER — Other Ambulatory Visit: Payer: Self-pay | Admitting: Family Medicine

## 2022-09-12 DIAGNOSIS — J309 Allergic rhinitis, unspecified: Secondary | ICD-10-CM

## 2022-09-12 DIAGNOSIS — G4700 Insomnia, unspecified: Secondary | ICD-10-CM

## 2022-09-12 DIAGNOSIS — J4540 Moderate persistent asthma, uncomplicated: Secondary | ICD-10-CM

## 2022-09-30 ENCOUNTER — Other Ambulatory Visit: Payer: Self-pay | Admitting: Family Medicine

## 2022-09-30 ENCOUNTER — Ambulatory Visit: Payer: 59 | Admitting: Family Medicine

## 2022-09-30 DIAGNOSIS — E669 Obesity, unspecified: Secondary | ICD-10-CM

## 2022-10-16 ENCOUNTER — Other Ambulatory Visit: Payer: Self-pay | Admitting: Family Medicine

## 2022-10-16 DIAGNOSIS — F429 Obsessive-compulsive disorder, unspecified: Secondary | ICD-10-CM

## 2022-10-16 DIAGNOSIS — F419 Anxiety disorder, unspecified: Secondary | ICD-10-CM

## 2022-10-26 ENCOUNTER — Other Ambulatory Visit: Payer: Self-pay | Admitting: Family Medicine

## 2022-10-26 DIAGNOSIS — E669 Obesity, unspecified: Secondary | ICD-10-CM

## 2022-10-26 MED ORDER — PHENTERMINE HCL 15 MG PO CAPS: 15.0000 mg | ORAL_CAPSULE | Freq: Every morning | ORAL | 0 refills | Status: DC

## 2022-10-26 NOTE — Progress Notes (Signed)
Rcv faxed refill request for phentermine from [facility]

## 2022-11-01 NOTE — Progress Notes (Unsigned)
I,Roshena L Chambers,acting as a scribe for Mila Merry, MD.,have documented all relevant documentation on the behalf of Mila Merry, MD,as directed by  Mila Merry, MD while in the presence of Mila Merry, MD.   Established patient visit   Patient: Joy Garcia   DOB: 1986/03/27   36 y.o. Female  MRN: [id number] Visit Date: 11/02/2022  Today's healthcare provider: Mila Merry, MD   Chief Complaint  Patient presents with   Anxiety   Obesity   Subjective    HPI  Anxiety, Follow-up  She was last seen for anxiety 7 months ago. Changes made at last visit include; Doing very well, improved since increasing fluvoxamine to 150mg .     She reports good compliance with treatment. She reports good tolerance of treatment. She is not having side effects.   She feels her anxiety is mild and Unchanged since last visit.  Symptoms: No chest pain No difficulty concentrating  No dizziness No fatigue  No feelings of losing control No insomnia  No irritable No palpitations  No panic attacks No racing thoughts  No shortness of breath No sweating  No tremors/shakes    GAD-7 Results     No data to display          PHQ-9 Scores    11/02/2022    8:53 AM 09/24/2021    4:06 PM 04/12/2021   10:10 AM  PHQ9 SCORE ONLY  PHQ-9 Total Score 2 6 4     ---------------------------------------------------------------------------------------------------  Follow up for obesity  Current treatment includes Phentermine.  She reports good compliance with treatment. She feels that condition is Unchanged. She is not having side effects.   -----------------------------------------------------------------------------------------    Medications: Outpatient Medications Prior to Visit  Medication Sig   albuterol (VENTOLIN HFA) 108 (90 Base) MCG/ACT inhaler Inhale 2 puffs into the lungs every 6 (six) hours as needed for wheezing or shortness of breath.   cyanocobalamin 1000 MCG  tablet Take 1,000 mcg by mouth daily.   fluticasone-salmeterol (ADVAIR) 250-50 MCG/ACT AEPB INHALE ONE PUFF BY MOUTH EVERY MORNING AND INHALE ONE PUFF BY MOUTH EVERY NIGHT AT BEDTIME   fluvoxaMINE (LUVOX) 100 MG tablet TAKE 1 AND 1/2 TABLET BY MOUTH EVERY NIGHT AT BEDTIME   montelukast (SINGULAIR) 10 MG tablet TAKE ONE TABLET BY MOUTH EVERY NIGHT AT BEDTIME   naproxen sodium (ANAPROX) 550 MG tablet Take 1 tablet (550 mg total) by mouth 2 (two) times daily with a meal.   Norethindrone Acetate-Ethinyl Estradiol (HAILEY 1.5/30) 1.5-30 MG-MCG tablet Take 1 tablet by mouth daily.   traZODone (DESYREL) 50 MG tablet TAKE 1/2 TO 1 TABLET BY MOUTH EVERY NIGHT AT BEDTIME AS NEEDED FOR SLEEP   [DISCONTINUED] clonazePAM (KLONOPIN) 0.5 MG tablet Take 0.5 mg by mouth as needed for anxiety. (Not taking)   phentermine 15 MG capsule Take 1 capsule (15 mg total) by mouth every morning.   [DISCONTINUED] cyclobenzaprine (FLEXERIL) 10 MG tablet Take 1 tablet (10 mg total) by mouth 3 (three) times daily as needed for muscle spasms. (Not taking)   [DISCONTINUED] gabapentin (NEURONTIN) 600 MG tablet TAKE ONE TABLET BY MOUTH THREE TIMES A DAY (not taking)   No facility-administered medications prior to visit.    Review of Systems  Constitutional:  Negative for appetite change, chills, fatigue and fever.  Respiratory:  Negative for chest tightness and shortness of breath.   Cardiovascular:  Negative for chest pain and palpitations.  Gastrointestinal:  Negative for abdominal pain, nausea and vomiting.  Neurological:  Negative for dizziness and weakness.        Objective    BP 124/79 (BP Location: Left Arm, Patient Position: Sitting, Cuff Size: Large)   Pulse 86   Ht 5\' 8"  (1.727 m)   Wt 204 lb (92.5 kg)   SpO2 100% Comment: room air  BMI 31.02 kg/m   Physical Exam  General appearance: Mildly obese female, cooperative and in no acute distress Head: Normocephalic, without obvious abnormality,  atraumatic Respiratory: Respirations even and unlabored, normal respiratory rate Extremities: All extremities are intact.  Skin: Skin color, texture, turgor normal. No rashes seen  Psych: Appropriate mood and affect. Neurologic: Mental status: Alert, oriented to person, place, and time, thought content appropriate.    Assessment & Plan     1. Obsessive-compulsive disorder, unspecified type Doing very well on current dose of fluvoxamine. Continue current medications.    2. Moderate persistent asthma without complication Well controlled on maintenance inhaler, rarely uses albuterol.   3. Class 1 obesity without serious comorbidity in adult, unspecified BMI, unspecified obesity type Doing well on current dose of phentermine,with BMI slightly above goal. Discussed possibly increasing dose or trial of a GLP-1 agonist. Will continue current regiment for now.  refill phentermine 15 MG capsule; Take 1 capsule (15 mg total) by mouth every morning.  Dispense: 30 capsule; Refill: 5  4. Vitamin B12 deficiency Feels much better since starting vitamin B12 replacement, now on oral supplement.  Lab Results  Component Value Date   VITAMINB12 805 08/09/2022   Continue current medications.    5. Anxiety Doing well on fluvoxamine, has not had to take clonazepam this year.       The entirety of the information documented in the History of Present Illness, Review of Systems and Physical Exam were personally obtained by me. Portions of this information were initially documented by the CMA and reviewed by me for thoroughness and accuracy.     08/11/2022, MD  [facility] [phone] (phone) [phone] (fax)  [facility]

## 2022-11-02 ENCOUNTER — Ambulatory Visit: Payer: 59 | Admitting: Family Medicine

## 2022-11-02 ENCOUNTER — Encounter: Payer: Self-pay | Admitting: Family Medicine

## 2022-11-02 VITALS — BP 124/79 | HR 86 | Ht 68.0 in | Wt 204.0 lb

## 2022-11-02 DIAGNOSIS — E538 Deficiency of other specified B group vitamins: Secondary | ICD-10-CM

## 2022-11-02 DIAGNOSIS — J4540 Moderate persistent asthma, uncomplicated: Secondary | ICD-10-CM | POA: Diagnosis not present

## 2022-11-02 DIAGNOSIS — F429 Obsessive-compulsive disorder, unspecified: Secondary | ICD-10-CM | POA: Diagnosis not present

## 2022-11-02 DIAGNOSIS — E669 Obesity, unspecified: Secondary | ICD-10-CM | POA: Diagnosis not present

## 2022-11-02 DIAGNOSIS — F419 Anxiety disorder, unspecified: Secondary | ICD-10-CM

## 2022-11-02 MED ORDER — PHENTERMINE HCL 15 MG PO CAPS: 15.0000 mg | ORAL_CAPSULE | Freq: Every morning | ORAL | 5 refills | Status: DC

## 2022-11-02 NOTE — Patient Instructions (Signed)
.   Please review the attached list of medications and notify my office if there are any errors.   . Please bring all of your medications to every appointment so we can make sure that our medication list is the same as yours.   

## 2022-11-14 ENCOUNTER — Telehealth: Payer: Self-pay | Admitting: Family Medicine

## 2022-11-14 NOTE — Telephone Encounter (Signed)
[facility] faxed refill request for the following medications:   gabapentin (NEURONTIN) 600 MG tablet    Please advise.

## 2022-11-14 NOTE — Telephone Encounter (Signed)
Denied refill. Medication is not prescribed by Dr. Sherrie Mustache.

## 2022-12-09 ENCOUNTER — Telehealth: Payer: Self-pay | Admitting: Family Medicine

## 2022-12-09 DIAGNOSIS — F429 Obsessive-compulsive disorder, unspecified: Secondary | ICD-10-CM

## 2022-12-09 DIAGNOSIS — F419 Anxiety disorder, unspecified: Secondary | ICD-10-CM

## 2022-12-09 MED ORDER — FLUVOXAMINE MALEATE 100 MG PO TABS: ORAL_TABLET | ORAL | 1 refills | Status: DC

## 2022-12-09 NOTE — Telephone Encounter (Signed)
Refill request fluvoxaMINE (LUVOX) 100 MG tablet

## 2022-12-27 ENCOUNTER — Other Ambulatory Visit: Payer: Self-pay | Admitting: Family Medicine

## 2022-12-27 DIAGNOSIS — Z3041 Encounter for surveillance of contraceptive pills: Secondary | ICD-10-CM

## 2022-12-27 DIAGNOSIS — J4540 Moderate persistent asthma, uncomplicated: Secondary | ICD-10-CM

## 2022-12-27 NOTE — Telephone Encounter (Signed)
Requested medication (s) are due for refill today:   Yes for both  Requested medication (s) are on the active medication list:   Yes for both  Future visit scheduled:   Yes    Last ordered: Hailey 02/02/2022 #84, 3 refills;   Advair 01/17/2022 #60 each, 11 refills  Returned because it's been a year since seen for these medications but does have an upcoming appt. In April with Dr. Caryn Section.   Requested Prescriptions  Pending Prescriptions Disp Refills   HAILEY 1.5/30 1.5-30 MG-MCG tablet [Pharmacy Med Name: HAILEY 21 1.5 MG-30 MCG TAB] 21 tablet     Sig: TAKE ONE TABLET BY MOUTH DAILY     OB/GYN:  Contraceptives Passed - 12/27/2022  7:59 AM      Passed - Last BP in normal range    BP Readings from Last 1 Encounters:  11/02/22 124/79         Passed - Valid encounter within last 12 months    Recent Outpatient Visits           1 month ago Obsessive-compulsive disorder, unspecified type   [facility] Birdie Sons, MD   7 months ago Vitamin B12 deficiency   [facility] Thedore Mins, [place], PA-C   9 months ago [place], Donald E, MD   9 months ago [place], [place], PA-C   1 year ago Class 1 obesity without serious comorbidity in adult, unspecified BMI, unspecified obesity type   [place], MD       Future Appointments             In 3 months Fisher, Kirstie Peri, MD [facility], [place] - Patient is not a smoker       fluticasone-salmeterol (ADVAIR) 250-50 MCG/ACT AEPB [Pharmacy Med Name: FLUTICASONE-SALMETEROL 250-50] 60 each 11    Sig: INHALE ONE PUFF BY MOUTH TWICE A DAY IN THE MORNING AND AT BEDTIME     Pulmonology:  Combination Products Passed - 12/27/2022  7:59 AM      Passed - Valid encounter within last 12 months    Recent Outpatient Visits           1 month ago Obsessive-compulsive disorder, unspecified type    [facility] Birdie Sons, MD   7 months ago Vitamin B12 deficiency   [facility] Thedore Mins, [place], PA-C   9 months ago [place], Donald E, MD   9 months ago [place], [place], PA-C   1 year ago Class 1 obesity without serious comorbidity in adult, unspecified BMI, unspecified obesity type   [facility] Birdie Sons, MD       Future Appointments             In 3 months Fisher, Kirstie Peri, MD [facility], [place]

## 2023-01-01 MED ORDER — FLUTICASONE-SALMETEROL 115-21 MCG/ACT IN AERO: 2.0000 | INHALATION_SPRAY | Freq: Two times a day (BID) | RESPIRATORY_TRACT | 12 refills | Status: DC

## 2023-01-01 NOTE — Telephone Encounter (Signed)
Per fax [facility], advair discus not covered. Advair hfa, airduo respiclick, bevespi, dulera, stiolto and budesonide-formoterol require no PA

## 2023-01-01 NOTE — Addendum Note (Signed)
Addended by: Birdie Sons on: 01/01/2023 09:41 AM   Modules accepted: Orders

## 2023-01-04 MED ORDER — FLUTICASONE FUROATE-VILANTEROL 100-25 MCG/ACT IN AEPB: 1.0000 | INHALATION_SPRAY | Freq: Every day | RESPIRATORY_TRACT | 12 refills | Status: DC

## 2023-01-04 NOTE — Telephone Encounter (Signed)
Received fax from [place] request change to [facility]

## 2023-01-04 NOTE — Addendum Note (Signed)
Addended by: Birdie Sons on: 01/04/2023 01:40 PM   Modules accepted: Orders

## 2023-01-11 IMAGING — MR MR CERVICAL SPINE W/O CM
5 series · 37 of 48 positions shown · non-contrast
Comparison: No pertinent prior exams available for comparison.

CLINICAL DATA: Strain of neck muscle, initial encounter. Additional
history provided by scanning technologist: Patient reports neck pain
radiating into both shoulders with left arm weakness and jaw pain.

EXAM:
MRI CERVICAL SPINE WITHOUT CONTRAST
TECHNIQUE: Multiplanar, multisequence MR imaging of the cervical spine was
performed. No intravenous contrast was administered.

[Series 5: T2 · sagittal · 3.0mm · 0.62mm/px · 6 of 15 slices shown (1 of 2)]
[im 1/15]
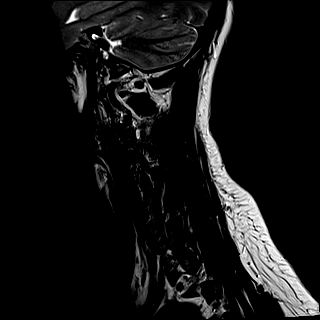
[im 3/15]
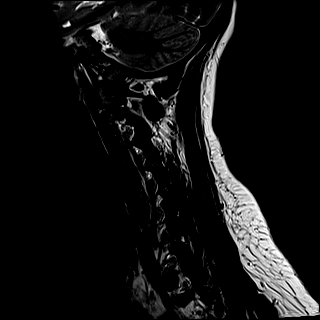
[im 6/15]
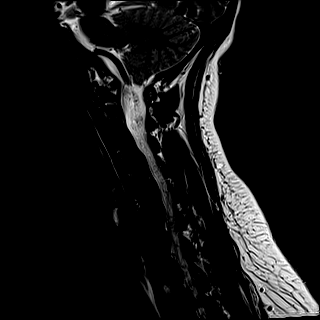
[im 9/15]
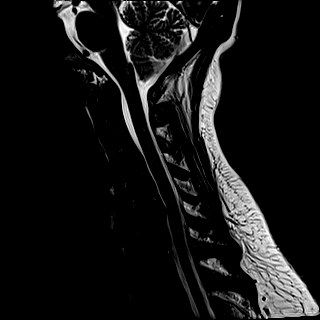
[im 12/15]
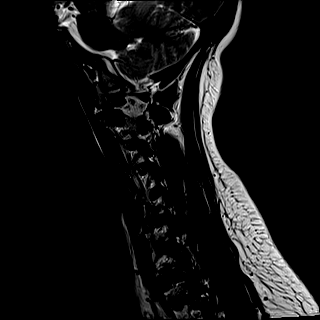
[im 15/15]
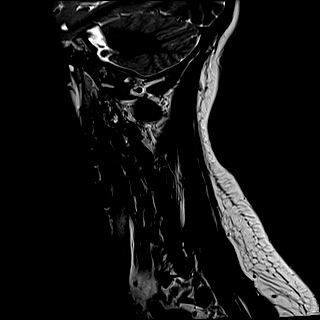

[Series 6: FLAIR · sagittal · 3.0mm · 0.78mm/px · 7 of 15 slices shown]
[im 1/15]
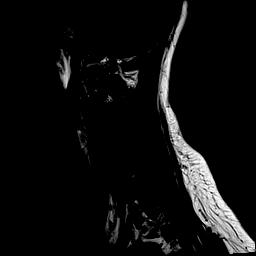
[im 3/15]
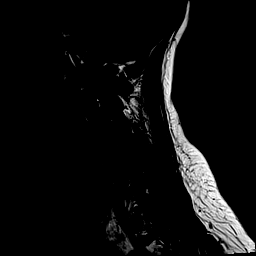
[im 5/15]
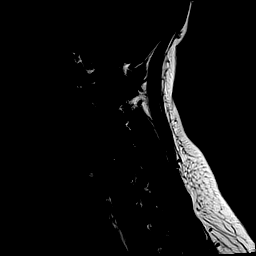
[im 8/15]
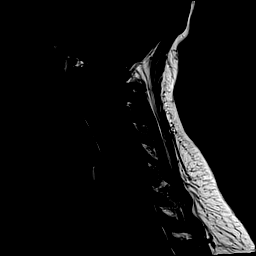
[im 10/15]
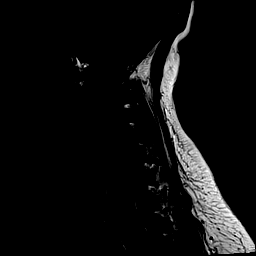
[im 12/15]
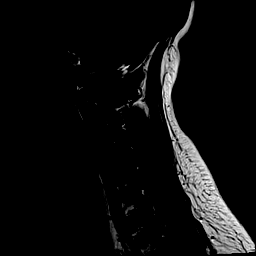
[im 15/15]
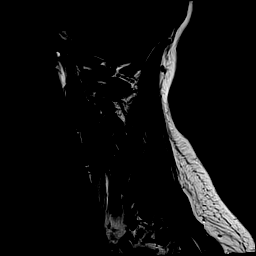

[Series 7: STIR · sagittal · 3.0mm · 0.62mm/px · 7 of 15 slices shown]
[im 1/15]
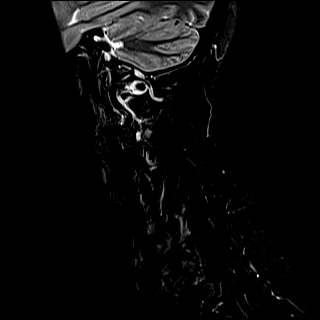
[im 3/15]
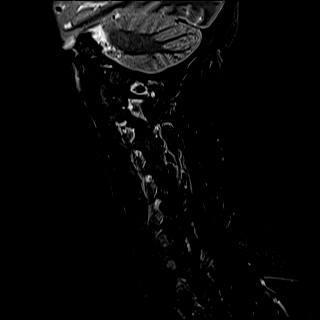
[im 5/15]
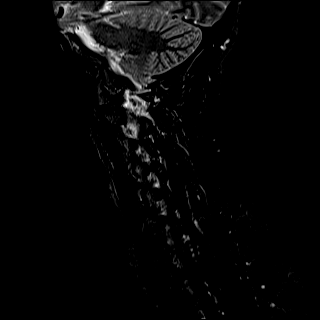
[im 8/15]
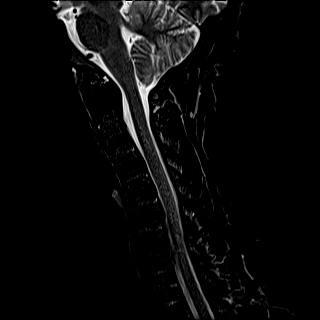
[im 10/15]
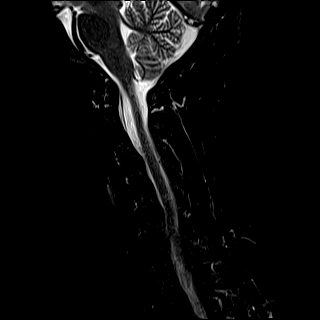
[im 12/15]
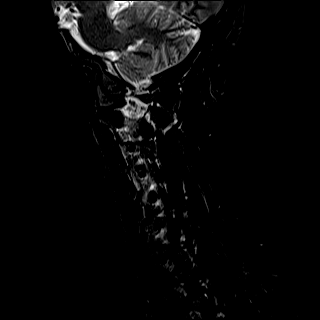
[im 15/15]
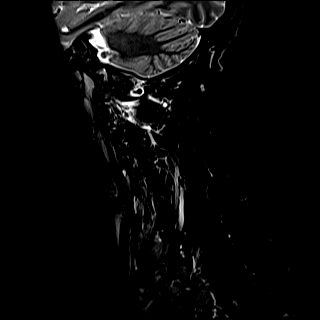

[Series 8: T2 · axial · 3.0mm · 0.70mm/px · z∈[-118,-22]mm · 9 of 30 slices shown (2 of 2)]
[im 1/30]
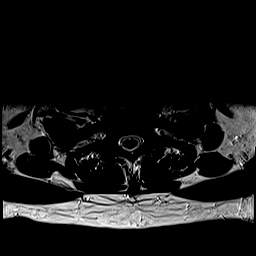
[im 3/30]
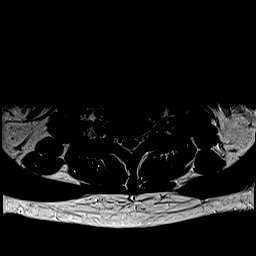
[im 5/30]
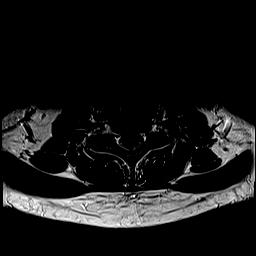
[im 9/30]
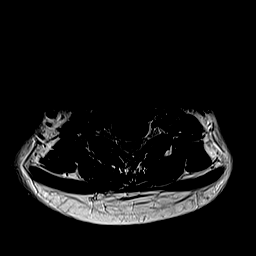
[im 14/30]
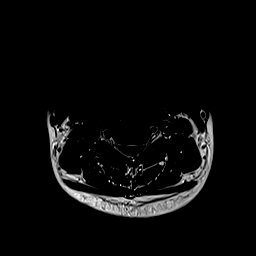
[im 16/30]
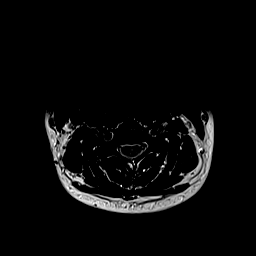
[im 21/30]
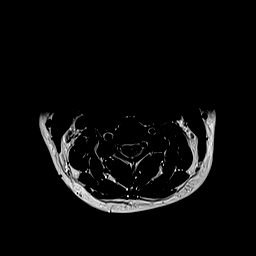
[im 25/30]
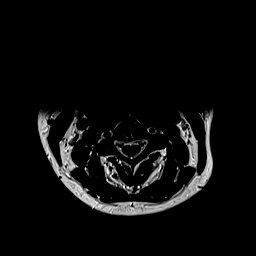
[im 30/30]
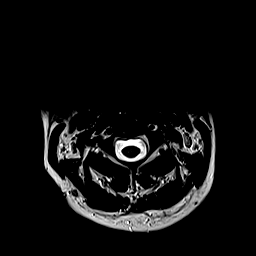

[Series 9: ax mpgr · axial · 3.0mm · 0.35mm/px · z∈[-118,-22]mm · 8 of 30 slices shown]
[im 1/30]
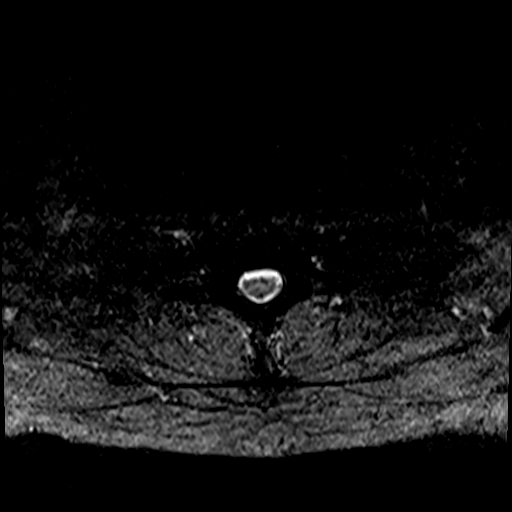
[im 5/30]
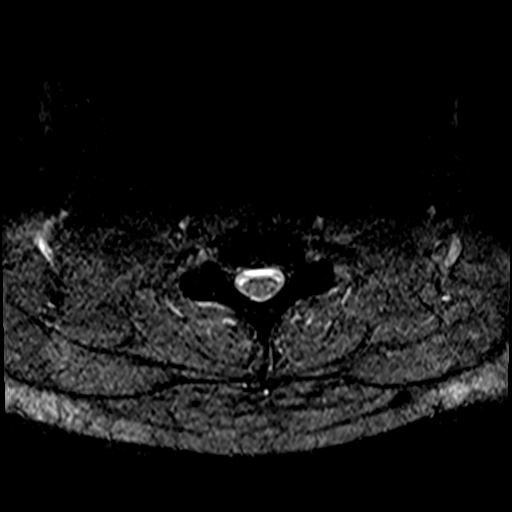
[im 9/30]
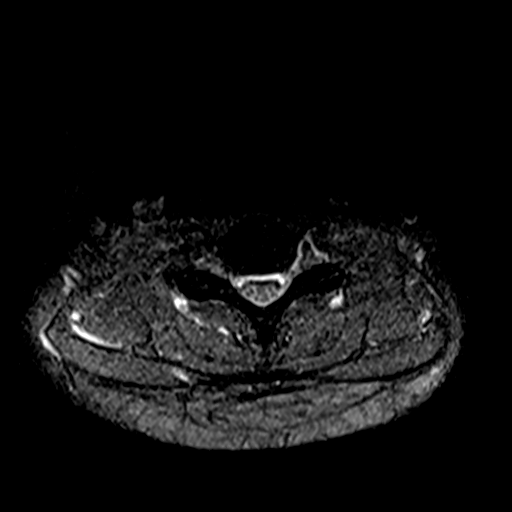
[im 14/30]
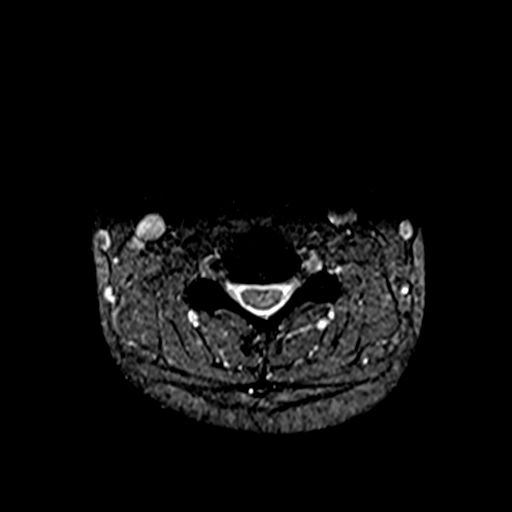
[im 16/30]
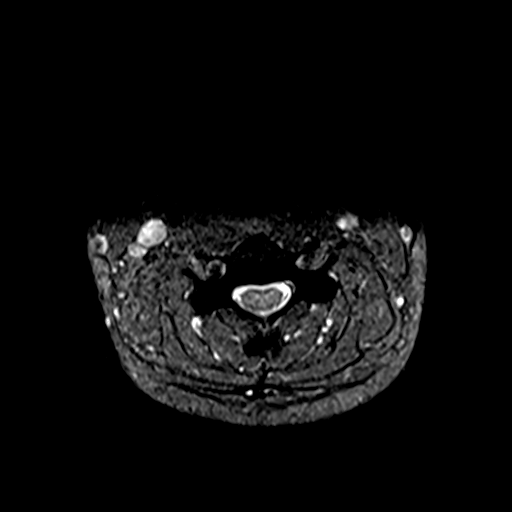
[im 21/30]
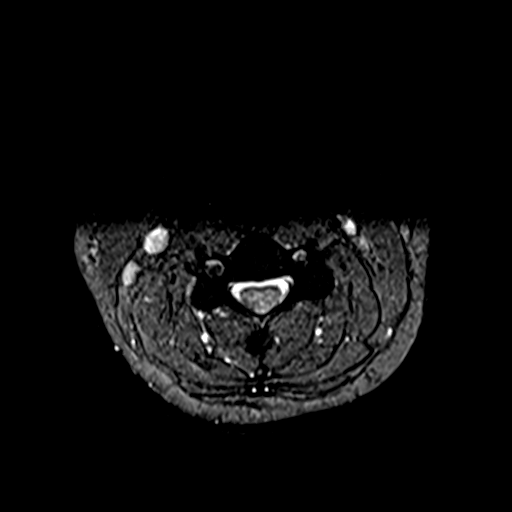
[im 25/30]
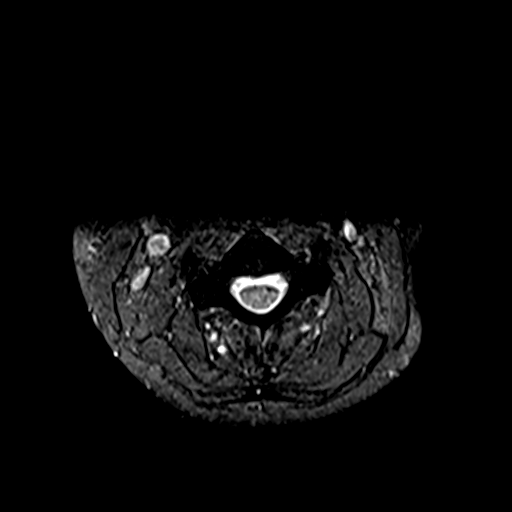
[im 30/30]
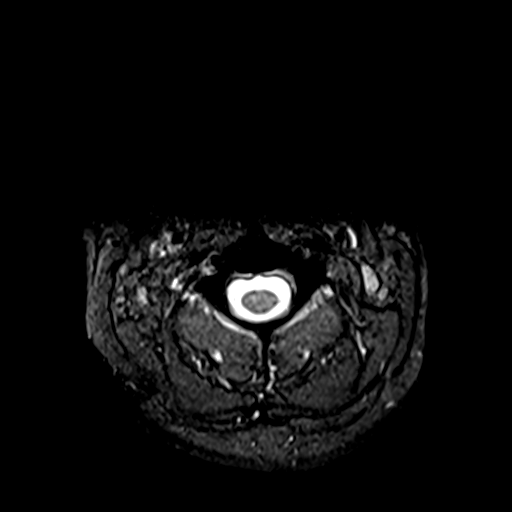

[37 of 48 positions shown; findings below may reference images not displayed]

FINDINGS: Alignment: Reversal of the expected cervical lordosis. No
significant spondylolisthesis.

Vertebrae: Vertebral body height is maintained. No significant
marrow edema or focal suspicious osseous lesion. T1 vertebral body
hemangioma.

Cord: No signal abnormality identified within the cervical spinal
cord.

Posterior Fossa, vertebral arteries, paraspinal tissues: No
abnormality identified within included portions of the posterior
fossa. Flow voids preserved within the imaged cervical vertebral
arteries. Paraspinal soft tissues unremarkable.

Disc levels:

Mild-to-moderate disc degeneration at C5-C6. No more than mild disc
degeneration at the remaining levels.

C2-C3: No significant disc herniation or stenosis.

C3-C4: No significant disc herniation or stenosis.

C4-C5: No significant disc herniation or stenosis.

C5-C6: Disc bulge. Mild uncovertebral hypertrophy on the left. Mild
partial effacement of the ventral thecal sac (without spinal cord
mass effect). No significant foraminal stenosis.

C6-C7: Shallow disc bulge. Mild partial effacement of the ventral
thecal sac (without spinal cord mass effect). No significant
foraminal stenosis.

C7-T1: No significant disc herniation or stenosis.
IMPRESSION: Cervical spondylosis, as outlined and with findings most notably as
follows.

At C5-C6, there is mild-to-moderate disc degeneration. Shallow disc
bulge. Mild uncovertebral hypertrophy on the left. Mild partial
effacement of the ventral thecal sac (without spinal cord mass
effect). No significant foraminal stenosis.

At C6-C7, there is mild disc degeneration. Shallow disc bulge. Mild
partial effacement of the ventral thecal sac (without spinal cord
mass effect). No significant foraminal stenosis.

Nonspecific reversal of the expected cervical lordosis.

## 2023-02-04 ENCOUNTER — Other Ambulatory Visit: Payer: Self-pay | Admitting: Family Medicine

## 2023-02-04 DIAGNOSIS — F429 Obsessive-compulsive disorder, unspecified: Secondary | ICD-10-CM

## 2023-02-04 DIAGNOSIS — F419 Anxiety disorder, unspecified: Secondary | ICD-10-CM

## 2023-02-06 NOTE — Telephone Encounter (Signed)
Requested Prescriptions  Pending Prescriptions Disp Refills   fluvoxaMINE (LUVOX) 100 MG tablet [Pharmacy Med Name: FLUVOXAMINE MALEATE 100 MG TAB] 135 tablet 0    Sig: TAKE 1 AND 1/2 TABLET BY MOUTH EVERY NIGHT     Psychiatry:  Antidepressants - SSRI Passed - 02/04/2023  6:53 AM      Passed - Valid encounter within last 6 months    Recent Outpatient Visits           3 months ago Obsessive-compulsive disorder, unspecified type   [place], Donald E, MD   9 months ago Vitamin B12 deficiency   [facility] Mikey Kirschner, PA-C   10 months ago [place], Donald E, MD   10 months ago [place], [place], PA-C   1 year ago Class 1 obesity without serious comorbidity in adult, unspecified BMI, unspecified obesity type   [place], MD       Future Appointments             In 1 month Fisher, Kirstie Peri, MD [facility], [facility]

## 2023-02-14 ENCOUNTER — Other Ambulatory Visit: Payer: Self-pay | Admitting: Physician Assistant

## 2023-02-14 DIAGNOSIS — J309 Allergic rhinitis, unspecified: Secondary | ICD-10-CM

## 2023-02-14 DIAGNOSIS — J4540 Moderate persistent asthma, uncomplicated: Secondary | ICD-10-CM

## 2023-02-14 MED ORDER — MONTELUKAST SODIUM 10 MG PO TABS: 10.0000 mg | ORAL_TABLET | Freq: Every day | ORAL | 5 refills | Status: DC

## 2023-02-16 ENCOUNTER — Other Ambulatory Visit: Payer: Self-pay | Admitting: Physician Assistant

## 2023-02-16 ENCOUNTER — Telehealth: Payer: Self-pay | Admitting: Family Medicine

## 2023-02-16 DIAGNOSIS — G4700 Insomnia, unspecified: Secondary | ICD-10-CM

## 2023-02-16 DIAGNOSIS — J309 Allergic rhinitis, unspecified: Secondary | ICD-10-CM

## 2023-02-16 DIAGNOSIS — J4540 Moderate persistent asthma, uncomplicated: Secondary | ICD-10-CM

## 2023-02-16 MED ORDER — TRAZODONE HCL 50 MG PO TABS: ORAL_TABLET | ORAL | 5 refills | Status: DC

## 2023-02-16 MED ORDER — MONTELUKAST SODIUM 10 MG PO TABS: 10.0000 mg | ORAL_TABLET | Freq: Every day | ORAL | 0 refills | Status: DC

## 2023-02-16 NOTE — Telephone Encounter (Signed)
Patient called in says wants the refill to go to [facility] to be covered 90 day supply under her insurance. [address] [place], [place] [address] [phone]. Then she says if it has to [facility]  mail order she is ok with that also. [phone]

## 2023-02-16 NOTE — Telephone Encounter (Signed)
Contacted patient to verify she would like this sent via mail order as it was sent to local pharmacy a few days ago CRM created.  Ok for [facility] to verify.  If changing to mail order service ok to fill per protocol. If not then patient is requesting rx too soon

## 2023-02-16 NOTE — Telephone Encounter (Signed)
[facility] [place] requesting new prescription traZODone (DESYREL) 50 MG tablet     Please advise

## 2023-02-16 NOTE — Telephone Encounter (Signed)
LOV 11/02/22 NOV 04/03/23 LRF 09/12/22 #[phone]

## 2023-02-16 NOTE — Telephone Encounter (Signed)
[facility] [place] requesting new prescription Montelukast Sodium 10 MG  Please advise

## 2023-02-20 ENCOUNTER — Other Ambulatory Visit: Payer: Self-pay | Admitting: Family Medicine

## 2023-02-20 DIAGNOSIS — Z3041 Encounter for surveillance of contraceptive pills: Secondary | ICD-10-CM

## 2023-02-20 DIAGNOSIS — G4700 Insomnia, unspecified: Secondary | ICD-10-CM

## 2023-02-20 DIAGNOSIS — J309 Allergic rhinitis, unspecified: Secondary | ICD-10-CM

## 2023-02-20 DIAGNOSIS — F429 Obsessive-compulsive disorder, unspecified: Secondary | ICD-10-CM

## 2023-02-20 DIAGNOSIS — J4540 Moderate persistent asthma, uncomplicated: Secondary | ICD-10-CM

## 2023-02-20 DIAGNOSIS — F419 Anxiety disorder, unspecified: Secondary | ICD-10-CM

## 2023-02-20 NOTE — Telephone Encounter (Signed)
Medication Refill - Medication: fluticasone furoate-vilanterol (BREO ELLIPTA) 100-25 MCG/ACT AEPB [id number]   Norethindrone Acetate-Ethinyl Estradiol (HAILEY 1.5/30) 1.5-30 MG-MCG tablet [id number]    fluvoxaMINE (LUVOX) 100 MG tablet [id number]   traZODone (DESYREL) 50 MG tablet [id number]  DISCONTINUED   montelukast (SINGULAIR) 10 MG tablet [id number]   Has the patient contacted their pharmacy? Yes.   (Agent: If no, request that the patient contact the pharmacy for the refill. If patient does not wish to contact the pharmacy document the reason why and proceed with request.) (Agent: If yes, when and what did the pharmacy advise?)  Preferred Pharmacy (with phone number or street name):  [facility] [place]     Has the patient been seen for an appointment in the last year OR does the patient have an upcoming appointment? Yes.    Agent: Please be advised that RX refills may take up to 3 business days. We ask that you follow-up with your pharmacy.

## 2023-02-20 NOTE — Telephone Encounter (Signed)
Trazodone already sent to [facility] on 02/16/23 #30/5 refills.

## 2023-02-21 MED ORDER — NORETHINDRONE ACET-ETHINYL EST 1.5-30 MG-MCG PO TABS: 1.0000 | ORAL_TABLET | Freq: Every day | ORAL | 6 refills | Status: DC

## 2023-02-21 MED ORDER — FLUTICASONE FUROATE-VILANTEROL 100-25 MCG/ACT IN AEPB: 1.0000 | INHALATION_SPRAY | Freq: Every day | RESPIRATORY_TRACT | 6 refills | Status: DC

## 2023-02-21 MED ORDER — MONTELUKAST SODIUM 10 MG PO TABS: 10.0000 mg | ORAL_TABLET | Freq: Every day | ORAL | 0 refills | Status: DC

## 2023-02-21 MED ORDER — FLUVOXAMINE MALEATE 100 MG PO TABS: ORAL_TABLET | ORAL | 0 refills | Status: DC

## 2023-02-21 NOTE — Telephone Encounter (Signed)
Requested Prescriptions  Pending Prescriptions Disp Refills   fluticasone furoate-vilanterol (BREO ELLIPTA) 100-25 MCG/ACT AEPB 28 each 6    Sig: Inhale 1 puff into the lungs daily.     Pulmonology:  Combination Products Passed - 02/20/2023  1:03 PM      Passed - Valid encounter within last 12 months    Recent Outpatient Visits           3 months ago Obsessive-compulsive disorder, unspecified type   [place], Donald E, MD   9 months ago Vitamin B12 deficiency   [facility] Mikey Kirschner, PA-C   11 months ago [place], Donald E, MD   11 months ago [place], [place], PA-C   1 year ago Class 1 obesity without serious comorbidity in adult, unspecified BMI, unspecified obesity type   [place], MD       Future Appointments             In 1 month Fisher, Kirstie Peri, MD [place], [facility]             fluvoxaMINE (LUVOX) 100 MG tablet 135 tablet 0    Sig: TAKE 1 AND 1/2 TABLET BY MOUTH EVERY NIGHT     Psychiatry:  Antidepressants - SSRI Passed - 02/20/2023  1:03 PM      Passed - Valid encounter within last 6 months    Recent Outpatient Visits           3 months ago Obsessive-compulsive disorder, unspecified type   [place], Donald E, MD   9 months ago Vitamin B12 deficiency   [facility] Mikey Kirschner, PA-C   11 months ago [place], Donald E, MD   11 months ago [place], [place], PA-C   1 year ago Class 1 obesity without serious comorbidity in adult, unspecified BMI, unspecified obesity type   [place], MD       Future Appointments             In 1  month Fisher, Kirstie Peri, MD [facility], [facility]             Norethindrone Acetate-Ethinyl Estradiol (HAILEY 1.5/30) 1.5-30 MG-MCG tablet 21 tablet 6    Sig: Take 1 tablet by mouth daily.     OB/GYN:  Contraceptives Passed - 02/20/2023  1:03 PM      Passed - Last BP in normal range    BP Readings from Last 1 Encounters:  11/02/22 124/79         Passed - Valid encounter within last 12 months    Recent Outpatient Visits           3 months ago Obsessive-compulsive disorder, unspecified type   [place], Donald E, MD   9 months ago Vitamin B12 deficiency   [facility] Mikey Kirschner, PA-C   11 months ago [place], MD   11 months ago [place], [place], PA-C   1 year ago Class 1 obesity without serious comorbidity in adult, unspecified BMI, unspecified  obesity type   [place], MD       Future Appointments             In 1 month Fisher, Kirstie Peri, MD [facility], [place] - Patient is not a smoker       montelukast (SINGULAIR) 10 MG tablet 90 tablet 0    Sig: Take 1 tablet (10 mg total) by mouth at bedtime.     Pulmonology:  Leukotriene Inhibitors Passed - 02/20/2023  1:03 PM      Passed - Valid encounter within last 12 months    Recent Outpatient Visits           3 months ago Obsessive-compulsive disorder, unspecified type   [place], Donald E, MD   9 months ago Vitamin B12 deficiency   [facility] Mikey Kirschner, PA-C   11 months ago [place], Donald E, MD   11 months ago [place], [place], PA-C   1 year ago Class 1 obesity without serious  comorbidity in adult, unspecified BMI, unspecified obesity type   [place], MD       Future Appointments             In 1 month Fisher, Kirstie Peri, MD [facility], [facility]             Pt. Requests.

## 2023-02-27 ENCOUNTER — Ambulatory Visit
Admission: EM | Admit: 2023-02-27 | Discharge: 2023-02-27 | Disposition: A | Discharge status: Home / Self Care | Payer: 59 | Attending: Emergency Medicine | Admitting: Emergency Medicine

## 2023-02-27 DIAGNOSIS — J4541 Moderate persistent asthma with (acute) exacerbation: Secondary | ICD-10-CM

## 2023-02-27 DIAGNOSIS — J0100 Acute maxillary sinusitis, unspecified: Secondary | ICD-10-CM

## 2023-02-27 DIAGNOSIS — H6692 Otitis media, unspecified, left ear: Secondary | ICD-10-CM | POA: Diagnosis not present

## 2023-02-27 HISTORY — DX: Unspecified asthma, uncomplicated: J45.909

## 2023-02-27 MED ORDER — AZITHROMYCIN 250 MG PO TABS: 250.0000 mg | ORAL_TABLET | Freq: Every day | ORAL | 0 refills | Status: DC

## 2023-02-27 MED ORDER — PREDNISONE 10 MG PO TABS: 40.0000 mg | ORAL_TABLET | Freq: Every day | ORAL | 0 refills | Status: AC

## 2023-02-27 NOTE — ED Provider Notes (Signed)
Roderic Palau    CSN: [id number] Arrival date & time: 02/27/23  0913      History   Chief Complaint Chief Complaint  Patient presents with   Ear Fullness    Entered by patient    HPI Joy Garcia is a 37 y.o. female.  Patient presents with 1 week history of congestion, sinus pressure, productive cough.  She has shortness of breath with coughing episodes.  She has been using her albuterol inhaler and OTC cold medicine.  She denies fever, sore throat, vomiting, diarrhea or other symptoms.  Her medical history includes asthma and allergies.  The history is provided by the patient and medical records.    Past Medical History:  Diagnosis Date   Asthma    OCD (obsessive compulsive disorder)     Patient Active Problem List   Diagnosis Date Noted   Vitamin B12 deficiency 05/10/2022   Moderate persistent asthma without complication [id number]   Class 1 obesity without serious comorbidity in adult 04/12/2021   Allergic rhinitis 04/12/2018   Anxiety 04/12/2018   OCD (obsessive compulsive disorder) 04/12/2018    History reviewed. No pertinent surgical history.  OB History     Gravida  0   Para  0   Term  0   Preterm  0   AB  0   Living  0      SAB  0   IAB  0   Ectopic  0   Multiple  0   Live Births  0            Home Medications    Prior to Admission medications   Medication Sig Start Date End Date Taking? Authorizing Provider  azithromycin (ZITHROMAX) 250 MG tablet Take 1 tablet (250 mg total) by mouth daily. Take first 2 tablets together, then 1 every day until finished. 02/27/23  Yes Sharion Balloon, NP  predniSONE (DELTASONE) 10 MG tablet Take 4 tablets (40 mg total) by mouth daily for 5 days. 02/27/23 03/04/23 Yes Sharion Balloon, NP  albuterol (VENTOLIN HFA) 108 (90 Base) MCG/ACT inhaler Inhale 2 puffs into the lungs every 6 (six) hours as needed for wheezing or shortness of breath. 03/28/22   Birdie Sons, MD  cyanocobalamin 1000 MCG  tablet Take 1,000 mcg by mouth daily.    [provider]  fluticasone furoate-vilanterol (BREO ELLIPTA) 100-25 MCG/ACT AEPB Inhale 1 puff into the lungs daily. 02/21/23   Birdie Sons, MD  fluvoxaMINE (LUVOX) 100 MG tablet TAKE 1 AND 1/2 TABLET BY MOUTH EVERY NIGHT 02/21/23   Birdie Sons, MD  montelukast (SINGULAIR) 10 MG tablet Take 1 tablet (10 mg total) by mouth at bedtime. 02/21/23   Birdie Sons, MD  naproxen sodium (ANAPROX) 550 MG tablet Take 1 tablet (550 mg total) by mouth 2 (two) times daily with a meal. 03/23/22   Ostwalt, Letitia Libra, PA-C  Norethindrone Acetate-Ethinyl Estradiol (HAILEY 1.5/30) 1.5-30 MG-MCG tablet Take 1 tablet by mouth daily. 02/21/23   Birdie Sons, MD  phentermine 15 MG capsule Take 1 capsule (15 mg total) by mouth every morning. 11/02/22   Birdie Sons, MD  traZODone (DESYREL) 50 MG tablet TAKE 1/2 TO 1 TABLET BY MOUTH EVERY NIGHT AT BEDTIME AS NEEDED FOR SLEEP 02/16/23   Mikey Kirschner, PA-C    Family History Family History  Problem Relation Age of Onset   Hyperlipidemia Mother    Hypertension Mother    Depression Mother    Cancer  Father        skin cancer   Atrial fibrillation Father    Hyperlipidemia Father    Hypertension Father    Heart disease Father    Stroke Father     Social History Social History   Tobacco Use   Smoking status: Never   Smokeless tobacco: Never  Vaping Use   Vaping Use: Never used  Substance Use Topics   Alcohol use: Yes   Drug use: No     Allergies   Wellbutrin [bupropion], Amoxicillin, Bactrim [sulfamethoxazole-trimethoprim], and Sulfa antibiotics   Review of Systems Review of Systems  Constitutional:  Negative for chills and fever.  HENT:  Positive for congestion, ear pain, postnasal drip and sinus pressure. Negative for sore throat.   Respiratory:  Positive for cough and shortness of breath.   Cardiovascular:  Negative for chest pain and palpitations.  Gastrointestinal:  Negative for  diarrhea and vomiting.  Skin:  Negative for color change and rash.  All other systems reviewed and are negative.    Physical Exam Triage Vital Signs ED Triage Vitals  Enc Vitals Group     BP 02/27/23 1012 120/86     Pulse Rate 02/27/23 1006 98     Resp 02/27/23 1006 18     Temp 02/27/23 1006 98.8 F (37.1 C)     Temp src --      SpO2 02/27/23 1006 98 %     Weight --      Height --      Head Circumference --      Peak Flow --      Pain Score 02/27/23 1010 4     Pain Loc --      Pain Edu? --      Excl. in [place]? --    No data found.  Updated Vital Signs BP 120/86   Pulse 98   Temp 98.8 F (37.1 C)   Resp 18   SpO2 98%   Visual Acuity Right Eye Distance:   Left Eye Distance:   Bilateral Distance:    Right Eye Near:   Left Eye Near:    Bilateral Near:     Physical Exam Vitals and nursing note reviewed.  Constitutional:      General: She is not in acute distress.    Appearance: Normal appearance. She is well-developed. She is not ill-appearing.  HENT:     Right Ear: Tympanic membrane normal.     Left Ear: Tympanic membrane is erythematous.     Nose: Congestion and rhinorrhea present.     Mouth/Throat:     Mouth: Mucous membranes are moist.     Pharynx: Posterior oropharyngeal erythema present.  Cardiovascular:     Rate and Rhythm: Normal rate and regular rhythm.     Heart sounds: Normal heart sounds.  Pulmonary:     Effort: Pulmonary effort is normal. No respiratory distress.     Breath sounds: Normal breath sounds.     Comments: Frequent tight-sounding cough.  No wheezing or respiratory distress.  Musculoskeletal:     Cervical back: Neck supple.  Skin:    General: Skin is warm and dry.  Neurological:     Mental Status: She is alert.  Psychiatric:        Mood and Affect: Mood normal.        Behavior: Behavior normal.      UC Treatments / Results  Labs (all labs ordered are listed, but only abnormal results are displayed) Labs  Reviewed - No data  to display  EKG   Radiology No results found.  Procedures Procedures (including critical care time)  Medications Ordered in UC Medications - No data to display  Initial Impression / Assessment and Plan / UC Course  I have reviewed the triage vital signs and the nursing notes.  Pertinent labs & imaging results that were available during my care of the patient were reviewed by me and considered in my medical decision making (see chart for details).   Exacerbation moderate persistent asthma, acute sinusitis, left otitis media.  No respiratory distress, O2 sat 98% on room air.  Patient has been using her albuterol inhaler.  Treating today with prednisone and Zithromax.  Instructed patient to follow up with her PCP if her symptoms are not improving.  Education provided on asthma and sinusitis.  She agrees to plan of care.    Final Clinical Impressions(s) / UC Diagnoses   Final diagnoses:  Moderate persistent asthma with acute exacerbation  Acute non-recurrent maxillary sinusitis  Left otitis media, unspecified otitis media type     Discharge Instructions      Take the Zithromax and prednisone as directed.  Use your albuterol inhaler as directed.  Follow up with your primary care provider if your symptoms are not improving.        ED Prescriptions     Medication Sig Dispense Auth. Provider   azithromycin (ZITHROMAX) 250 MG tablet Take 1 tablet (250 mg total) by mouth daily. Take first 2 tablets together, then 1 every day until finished. 6 tablet Sharion Balloon, NP   predniSONE (DELTASONE) 10 MG tablet Take 4 tablets (40 mg total) by mouth daily for 5 days. 20 tablet Sharion Balloon, NP      PDMP not reviewed this encounter.   Sharion Balloon, NP 02/27/23 [phone]

## 2023-02-27 NOTE — ED Triage Notes (Signed)
Patient to Urgent Care with complaints of left sided ear fullness, sinus congestion and pressure, productive cough w/ green and yellow sputum. Symptoms started over a week ago.   Denies any recent fevers. Has been taking tylenol/ cold and flu meds.

## 2023-02-27 NOTE — Discharge Instructions (Addendum)
Take the Zithromax and prednisone as directed.  Use your albuterol inhaler as directed.  Follow up with your primary care provider if your symptoms are not improving.   ? ? ?

## 2023-03-10 ENCOUNTER — Other Ambulatory Visit: Payer: Self-pay | Admitting: Family Medicine

## 2023-03-10 DIAGNOSIS — G4700 Insomnia, unspecified: Secondary | ICD-10-CM

## 2023-04-03 ENCOUNTER — Ambulatory Visit: Payer: 59 | Admitting: Family Medicine

## 2023-04-26 ENCOUNTER — Encounter: Payer: Self-pay | Admitting: Nurse Practitioner

## 2023-04-26 ENCOUNTER — Ambulatory Visit (INDEPENDENT_AMBULATORY_CARE_PROVIDER_SITE_OTHER): Payer: 59 | Admitting: Nurse Practitioner

## 2023-04-26 ENCOUNTER — Other Ambulatory Visit (HOSPITAL_COMMUNITY)
Admission: RE | Admit: 2023-04-26 | Discharge: 2023-04-26 | Disposition: A | Discharge status: Home / Self Care | Payer: 59 | Source: Ambulatory Visit | Attending: Nurse Practitioner | Admitting: Nurse Practitioner

## 2023-04-26 VITALS — BP 116/78 | Ht 66.5 in | Wt 202.0 lb

## 2023-04-26 DIAGNOSIS — Z3041 Encounter for surveillance of contraceptive pills: Secondary | ICD-10-CM

## 2023-04-26 DIAGNOSIS — Z124 Encounter for screening for malignant neoplasm of cervix: Secondary | ICD-10-CM | POA: Insufficient documentation

## 2023-04-26 DIAGNOSIS — Z01419 Encounter for gynecological examination (general) (routine) without abnormal findings: Secondary | ICD-10-CM

## 2023-04-26 NOTE — Progress Notes (Signed)
   Oretha Abby December 18, 1985 [id number]   History:  37 y.o. G0 presents as new patient to establish care. COCs continuously. LEEP at age 54. Normal paps since. Anxiety, OCD, B12 deficiency managed by PCP. H/O infertility.   Gynecologic History No LMP recorded. (Menstrual status: Oral contraceptives).   Contraception/Family planning: OCP (estrogen/progesterone) Sexually active: Yes  Health Maintenance Last Pap: 02/22/2018. Results were: Normal neg HPV Last mammogram: Not indicated Last colonoscopy: Not indicated Last Dexa: Not indicated  Past medical history, past surgical history, family history and social history were all reviewed and documented in the EPIC chart. Engaged. Director of memory care unit.   ROS:  A ROS was performed and pertinent positives and negatives are included.  Exam:  Vitals:   04/26/23 1327  BP: 116/78  Weight: 202 lb (91.6 kg)  Height: 5' 6.5" (1.689 m)   Body mass index is 32.12 kg/m.  General appearance:  Normal Thyroid:  Symmetrical, normal in size, without palpable masses or nodularity. Respiratory  Auscultation:  Clear without wheezing or rhonchi Cardiovascular  Auscultation:  Regular rate, without rubs, murmurs or gallops  Edema/varicosities:  Not grossly evident Abdominal  Soft,nontender, without masses, guarding or rebound.  Liver/spleen:  No organomegaly noted  Hernia:  None appreciated  Skin  Inspection:  Grossly normal Breasts: Examined lying and sitting.   Right: Without masses, retractions, nipple discharge or axillary adenopathy.   Left: Without masses, retractions, nipple discharge or axillary adenopathy. Genitourinary   Inguinal/mons:  Normal without inguinal adenopathy  External genitalia:  Normal appearing vulva with no masses, tenderness, or lesions  BUS/Urethra/Skene's glands:  Normal  Vagina:  Normal appearing with normal color and discharge, no lesions  Cervix:  Normal appearing without discharge or lesions  Uterus:   Normal in size, shape and contour.  Midline and mobile, nontender  Adnexa/parametria:     Rt: Normal in size, without masses or tenderness.   Lt: Normal in size, without masses or tenderness.  Anus and perineum: Normal  Digital rectal exam: Deferred  Patient informed chaperone available to be present for breast and pelvic exam. Patient has requested no chaperone to be present. Patient has been advised what will be completed during breast and pelvic exam.   Assessment/Plan:  37 y.o. G0 to establish care.   Well female exam with routine gynecological exam - Education provided on SBEs, importance of preventative screenings, current guidelines, high calcium diet, regular exercise, and multivitamin daily.  Labs with PCP.   Screening for cervical cancer - Plan: Cytology - PAP( [facility]). LEEP at age 32, paps normal since.   Encounter for surveillance of contraceptive pills - COCs continuously. Prescribed by PCP. H/O heavy, painful periods.  Return in 1 year for annual.     Olivia Mackie DNP, 1:50 PM 04/26/2023

## 2023-04-28 ENCOUNTER — Ambulatory Visit: Payer: 59 | Admitting: Family Medicine

## 2023-04-28 VITALS — BP 120/75 | HR 80 | Resp 16 | Ht 67.0 in | Wt 204.0 lb

## 2023-04-28 DIAGNOSIS — J4540 Moderate persistent asthma, uncomplicated: Secondary | ICD-10-CM

## 2023-04-28 DIAGNOSIS — E669 Obesity, unspecified: Secondary | ICD-10-CM | POA: Diagnosis not present

## 2023-04-28 DIAGNOSIS — E538 Deficiency of other specified B group vitamins: Secondary | ICD-10-CM

## 2023-04-28 DIAGNOSIS — Z136 Encounter for screening for cardiovascular disorders: Secondary | ICD-10-CM

## 2023-04-28 DIAGNOSIS — F429 Obsessive-compulsive disorder, unspecified: Secondary | ICD-10-CM

## 2023-04-28 LAB — CYTOLOGY - PAP
Adequacy: ABSENT
Comment: NEGATIVE
Diagnosis: NEGATIVE
High risk HPV: NEGATIVE

## 2023-04-28 MED ORDER — PHENTERMINE HCL 30 MG PO CAPS: 30.0000 mg | ORAL_CAPSULE | ORAL | 3 refills | Status: DC

## 2023-04-28 NOTE — Progress Notes (Signed)
I,Joseline E Rosas,acting as a scribe for Mila Merry, MD.,have documented all relevant documentation on the behalf of Mila Merry, MD,as directed by  Mila Merry, MD   Established patient visit   Patient: Joy Garcia   DOB: 1986/08/18   37 y.o. Female  MRN: [id number] Visit Date: 04/28/2023  Today's healthcare provider: Mila Merry, MD   Chief Complaint  Patient presents with   Follow-up   Subjective    HPI  Patient here for follow-up on B12 deficiency, asthma, OCD, and weight management.  Follow up for weight management  The patient was last seen for this 5 months ago. Changes made at last visit include continue Phentermine. She feels like phentermine is keeping her from putting weight back on, but she has not been able to lose any more weight for several months.   Current exercise regimen:Patient pretty active at work. She has a home gym. Current diet regimen: Well balanced. Protein shakes.  Wt Readings from Last 10 Encounters:  04/28/23 204 lb (92.5 kg)  04/26/23 202 lb (91.6 kg)  11/02/22 204 lb (92.5 kg)  05/11/22 209 lb 8 oz (95 kg)  03/28/22 207 lb (93.9 kg)  03/23/22 206 lb 9.6 oz (93.7 kg)  09/24/21 196 lb 11.2 oz (89.2 kg)  05/26/21 199 lb 3.2 oz (90.4 kg)  04/12/21 211 lb 9.6 oz (96 kg)  07/15/20 190 lb 9.6 oz (86.5 kg)     BP Readings from Last 3 Encounters:  04/28/23 120/75  04/26/23 116/78  02/27/23 120/86   -----------------------------------------------------------------------------------------   She feels current dose of Luvox for OCD and anxiety continues to work very well, on rare occasions she will increase briefly to 1 1/2 tablet daily.   She has been changed from Advair 250 to Breo 100 since last visit due to insurance. She feels this is working fairly well, although has had a little more shortness of breath and wheezing since changing her medication. However she rarely needs to use her rescue inhaler.    Medications: Outpatient Medications Prior to Visit  Medication Sig   albuterol (VENTOLIN HFA) 108 (90 Base) MCG/ACT inhaler Inhale 2 puffs into the lungs every 6 (six) hours as needed for wheezing or shortness of breath.   cyanocobalamin 1000 MCG tablet Take 1,000 mcg by mouth daily.   fluticasone furoate-vilanterol (BREO ELLIPTA) 100-25 MCG/ACT AEPB Inhale 1 puff into the lungs daily.   fluvoxaMINE (LUVOX) 100 MG tablet TAKE 1 AND 1/2 TABLET BY MOUTH EVERY NIGHT   montelukast (SINGULAIR) 10 MG tablet Take 1 tablet (10 mg total) by mouth at bedtime.   naproxen sodium (ANAPROX) 550 MG tablet Take 1 tablet (550 mg total) by mouth 2 (two) times daily with a meal.   Norethindrone Acetate-Ethinyl Estradiol (HAILEY 1.5/30) 1.5-30 MG-MCG tablet Take 1 tablet by mouth daily.   phentermine 15 MG capsule Take 1 capsule (15 mg total) by mouth every morning.   traZODone (DESYREL) 50 MG tablet TAKE 1/2 TO 1 TABLET BY MOUTH EVERY NIGHT AT BEDTIME AS NEEDED FOR SLEEP   No facility-administered medications prior to visit.    Review of Systems  Constitutional:  Negative for appetite change, chills, fatigue and fever.  Respiratory:  Negative for chest tightness and shortness of breath.   Cardiovascular:  Negative for chest pain and palpitations.  Gastrointestinal:  Negative for abdominal pain, nausea and vomiting.  Neurological:  Negative for dizziness and weakness.       Objective    BP 120/75 (BP Location:  Left Arm, Patient Position: Sitting, Cuff Size: Large)   Pulse 80   Resp 16   Ht 5\' 7"  (1.702 m)   Wt 204 lb (92.5 kg)   BMI 31.95 kg/m    Physical Exam   General: Appearance:    Mildly obese female in no acute distress  Eyes:    PERRL, conjunctiva/corneas clear, EOM's intact       Lungs:     Clear to auscultation bilaterally, respirations unlabored  Heart:    Normal heart rate. Normal rhythm. No murmurs, rubs, or gallops.    MS:   All extremities are intact.    Neurologic:   Awake,  alert, oriented x 3. No apparent focal neurological defect.         Assessment & Plan     1. Class 1 obesity without serious comorbidity in adult, unspecified BMI, unspecified obesity type Tried topiramate in the past which didn't seem to make much difference. Also taken 30mg  phentermine in the past which she reports she did well with. Will increase from 15mg  to phentermine 30 MG capsule; Take 1 capsule (30 mg total) by mouth every morning.  Dispense: 30 capsule; Refill: 3  2. Vitamin B12 deficiency Feel much better since starting b12 supplements.  - Vitamin B12  3. Moderate persistent asthma without complication Doing well since change from Advair 250 to Breo 100. A little more wheezing which she feels may be related to going through allergy season. Rarely using albuterol. Advised to let me know if she feels she needs higher dose of Breo.   4. Obsessive-compulsive disorder, unspecified type Doing very well on current dose of Luvox   5. Encounter for special screening examination for cardiovascular disorder  - Lipid panel      The entirety of the information documented in the History of Present Illness, Review of Systems and Physical Exam were personally obtained by me. Portions of this information were initially documented by the CMA and reviewed by me for thoroughness and accuracy.     Mila Merry, MD  [facility] [phone] (phone) [phone] (fax)  [facility]

## 2023-05-03 LAB — LIPID PANEL
Chol/HDL Ratio: 3.9 ratio (ref 0.0–4.4)
Cholesterol, Total: 229 mg/dL — ABNORMAL HIGH (ref 100–199)
HDL: 58 mg/dL (ref >39)
LDL Chol Calc (NIH): 149 mg/dL — ABNORMAL HIGH (ref 0–99)
Triglycerides: 121 mg/dL (ref 0–149)
VLDL Cholesterol Cal: 22 mg/dL (ref 5–40)

## 2023-05-03 LAB — VITAMIN B12: Vitamin B-12: >2000 pg/mL — ABNORMAL HIGH (ref 232–1245)

## 2023-05-12 ENCOUNTER — Other Ambulatory Visit: Payer: Self-pay | Admitting: Family Medicine

## 2023-05-12 DIAGNOSIS — J309 Allergic rhinitis, unspecified: Secondary | ICD-10-CM

## 2023-05-12 DIAGNOSIS — F419 Anxiety disorder, unspecified: Secondary | ICD-10-CM

## 2023-05-12 DIAGNOSIS — F429 Obsessive-compulsive disorder, unspecified: Secondary | ICD-10-CM

## 2023-05-12 DIAGNOSIS — J4540 Moderate persistent asthma, uncomplicated: Secondary | ICD-10-CM

## 2023-07-26 ENCOUNTER — Encounter: Payer: Self-pay | Admitting: Physician Assistant

## 2023-07-26 ENCOUNTER — Other Ambulatory Visit: Payer: Self-pay | Admitting: Family Medicine

## 2023-07-26 DIAGNOSIS — Z3041 Encounter for surveillance of contraceptive pills: Secondary | ICD-10-CM

## 2023-07-30 ENCOUNTER — Encounter: Payer: Self-pay | Admitting: Family Medicine

## 2023-07-30 DIAGNOSIS — F419 Anxiety disorder, unspecified: Secondary | ICD-10-CM

## 2023-07-31 MED ORDER — CLONAZEPAM 0.5 MG PO TABS
0.5000 mg | ORAL_TABLET | ORAL | 1 refills | Status: DC | PRN
Start: 2023-07-31 — End: 2024-07-31

## 2023-08-10 ENCOUNTER — Other Ambulatory Visit: Payer: Self-pay | Admitting: Physician Assistant

## 2023-08-10 DIAGNOSIS — G4700 Insomnia, unspecified: Secondary | ICD-10-CM

## 2023-08-18 ENCOUNTER — Other Ambulatory Visit: Payer: Self-pay | Admitting: Family Medicine

## 2023-08-18 DIAGNOSIS — Z3041 Encounter for surveillance of contraceptive pills: Secondary | ICD-10-CM

## 2023-08-18 NOTE — Telephone Encounter (Signed)
Medication Refill - Medication: Norethindrone Estradiol 1.5/30   Has the patient contacted their pharmacy? Yes.   (Agent: If no, request that the patient contact the pharmacy for the refill. If patient does not wish to contact the pharmacy document the reason why and proceed with request.) (Agent: If yes, when and what did the pharmacy advise?)  Preferred Pharmacy (with phone number or street name): [facility] Has the patient been seen for an appointment in the last year OR does the patient have an upcoming appointment? Yes.    Agent: Please be advised that RX refills may take up to 3 business days. We ask that you follow-up with your pharmacy.

## 2023-08-21 MED ORDER — NORETHINDRONE ACET-ETHINYL EST 1.5-30 MG-MCG PO TABS
1.0000 | ORAL_TABLET | Freq: Every day | ORAL | 6 refills | Status: DC
Start: 2023-08-21 — End: 2024-01-18

## 2023-08-21 NOTE — Telephone Encounter (Signed)
Requested Prescriptions  Pending Prescriptions Disp Refills   Norethindrone Acetate-Ethinyl Estradiol (HAILEY 1.5/30) 1.5-30 MG-MCG tablet 21 tablet 6    Sig: Take 1 tablet by mouth daily.     OB/GYN:  Contraceptives Passed - 08/18/2023  5:21 PM      Passed - Last BP in normal range    BP Readings from Last 1 Encounters:  04/28/23 120/75         Passed - Valid encounter within last 12 months    Recent Outpatient Visits           3 months ago Encounter for special screening examination for cardiovascular disorder   [facility] Malva Limes, MD   9 months ago Obsessive-compulsive disorder, unspecified type   [facility] Malva Limes, MD   1 year ago Vitamin B12 deficiency   [facility] Alfredia Ferguson, PA-C   1 year ago Anxiety   [facility] Malva Limes, MD   1 year ago Pain   [facility] [place], [place], [place]              Passed - Patient is not a smoker

## 2023-09-09 ENCOUNTER — Other Ambulatory Visit: Payer: Self-pay | Admitting: Family Medicine

## 2023-09-09 DIAGNOSIS — J4540 Moderate persistent asthma, uncomplicated: Secondary | ICD-10-CM

## 2023-09-11 NOTE — Telephone Encounter (Signed)
Requested Prescriptions  Pending Prescriptions Disp Refills   fluticasone furoate-vilanterol (BREO ELLIPTA) 100-25 MCG/ACT AEPB [Pharmacy Med Name: BREO ELLIPTA INH 100-25] 60 each 6    Sig: USE 1 INHALATION ORALLY    DAILY     Pulmonology:  Combination Products Passed - 09/09/2023  8:09 AM      Passed - Valid encounter within last 12 months    Recent Outpatient Visits           4 months ago Encounter for special screening examination for cardiovascular disorder   [facility] Malva Limes, MD   10 months ago Obsessive-compulsive disorder, unspecified type   [facility] Malva Limes, MD   1 year ago Vitamin B12 deficiency   [facility] Alfredia Ferguson, PA-C   1 year ago Anxiety   [facility] Malva Limes, MD   1 year ago Pain   [facility] [place], [place], [place]

## 2023-10-16 ENCOUNTER — Telehealth: Payer: Self-pay | Admitting: Family Medicine

## 2023-10-16 DIAGNOSIS — J4540 Moderate persistent asthma, uncomplicated: Secondary | ICD-10-CM

## 2023-10-16 MED ORDER — FLUTICASONE FUROATE-VILANTEROL 100-25 MCG/ACT IN AEPB
1.0000 | INHALATION_SPRAY | Freq: Every day | RESPIRATORY_TRACT | 3 refills | Status: DC
Start: 2023-10-16 — End: 2024-10-07

## 2023-10-16 NOTE — Telephone Encounter (Signed)
[facility] requesting a 90 day supply of  fluticasone furoate-vilanterol (BREO ELLIPTA) 100-25 MCG/ACT AEPB.    Note from pharmacy:  Your pt's prescription plan requires three month supplies on refills.

## 2023-11-22 ENCOUNTER — Telehealth: Payer: 59

## 2023-12-06 ENCOUNTER — Other Ambulatory Visit: Payer: Self-pay | Admitting: Family Medicine

## 2023-12-06 DIAGNOSIS — E66811 Obesity, class 1: Secondary | ICD-10-CM

## 2024-01-16 ENCOUNTER — Other Ambulatory Visit: Payer: Self-pay | Admitting: Family Medicine

## 2024-01-16 DIAGNOSIS — Z3041 Encounter for surveillance of contraceptive pills: Secondary | ICD-10-CM

## 2024-02-11 ENCOUNTER — Other Ambulatory Visit: Payer: Self-pay | Admitting: Family Medicine

## 2024-02-11 DIAGNOSIS — G4700 Insomnia, unspecified: Secondary | ICD-10-CM

## 2024-03-28 ENCOUNTER — Other Ambulatory Visit: Payer: Self-pay | Admitting: Family Medicine

## 2024-03-28 DIAGNOSIS — Z3041 Encounter for surveillance of contraceptive pills: Secondary | ICD-10-CM

## 2024-04-01 ENCOUNTER — Encounter: Payer: Self-pay | Admitting: Family Medicine

## 2024-04-02 ENCOUNTER — Ambulatory Visit: Attending: Family Medicine

## 2024-04-02 ENCOUNTER — Ambulatory Visit (INDEPENDENT_AMBULATORY_CARE_PROVIDER_SITE_OTHER): Admitting: Family Medicine

## 2024-04-02 ENCOUNTER — Encounter: Payer: Self-pay | Admitting: Family Medicine

## 2024-04-02 VITALS — BP 152/94 | HR 105 | Ht 68.0 in | Wt 211.8 lb

## 2024-04-02 DIAGNOSIS — R002 Palpitations: Secondary | ICD-10-CM

## 2024-04-02 DIAGNOSIS — I479 Paroxysmal tachycardia, unspecified: Secondary | ICD-10-CM | POA: Diagnosis not present

## 2024-04-02 NOTE — Progress Notes (Signed)
 Acute visit   Patient: Joy Garcia   DOB: 26-Jan-1986   38 y.o. Female  MRN: [id number] PCP: Lamon Pillow, MD   Chief Complaint  Patient presents with   Irregular Heart Beat    Patient reports heart rate changes when standing up X 2 days. Pt reports having videos of changes taking place. Also reports noticing a increase when stepping outside for some air for the first this past weekend   Subjective    Discussed the use of AI scribe software for clinical note transcription with the patient, who gave verbal consent to proceed.  History of Present Illness   The most concerning symptom is a recent onset of tachycardia, with heart rates reaching up to 154 bpm, as recorded by the patient's Apple watch. This is often accompanied by lightheadedness.  The patient also reports urinary frequency, needing to urinate every 30-45 minutes, which significantly disrupts daily life. Accompanying this is a sensation of intense heat, akin to hot flashes, particularly after showering. The patient has also been experiencing severe fatigue, to the point of falling asleep after sitting for just a few minutes. This is coupled with significant brain fog and memory problems, which have been impacting the patient's ability to work.  The patient has also been dealing with gastrointestinal issues, including severe constipation and episodes of intense pain that wake her up in the middle of the night. These episodes can last for several hours and are often accompanied by a feeling of faintness. The patient manages these symptoms with Metamucil and Senna.  Additionally, the patient has been diagnosed with hyperhidrosis and has been experiencing severe shoulder pain, initially thought to be a work-related injury. An MRI revealed a bulging disc and cervical spondylosis, which has led to a loss of feeling in the left hand and difficulty holding objects. The patient receives ESI injections for this.  The patient  has also noted a sudden weight gain of about 30 pounds, which has been resistant to efforts to lose it. The patient has a history of poor sleep, but a recent sleep study ruled out sleep apnea. However, the study did reveal that the patient's heart rate can reach up to 119 bpm during sleep.        Review of Systems  Objective    BP (!) 152/94 (BP Location: Left Arm, Patient Position: Sitting, Cuff Size: Large)   Pulse (!) 105   Ht 5\' 8"  (1.727 m)   Wt 211 lb 12.8 oz (96.1 kg)   SpO2 100%   BMI 32.20 kg/m   Orthostatic Vitals for the past 48 hrs (Last 6 readings):  Patient Position BP Pulse BP Location Cuff Size Patient Position (if appropriate) BP- Standing at 0 minutes Pulse- Standing at 0 minutes BP- Sitting Pulse- Sitting BP- Lying Pulse- Lying  04/02/24 1410 Sitting (!) 152/94 (!) 105 Left Arm Large -- -- -- -- -- -- --  04/02/24 1413 -- -- -- -- -- Orthostatic Vitals (!) 142/99 (!) 15 (!) 143/97 102 (!) 128/91 97  04/02/24 1431 -- -- -- -- -- -- -- 150 -- -- -- --    Physical Exam Vitals reviewed.  Constitutional:      General: She is not in acute distress.    Appearance: Normal appearance. She is well-developed. She is not diaphoretic.  HENT:     Head: Normocephalic and atraumatic.  Eyes:     General: No scleral icterus.    Conjunctiva/sclera: Conjunctivae normal.  Neck:  Thyroid: No thyromegaly.  Cardiovascular:     Rate and Rhythm: Normal rate and regular rhythm.     Heart sounds: Normal heart sounds. No murmur heard. Pulmonary:     Effort: Pulmonary effort is normal. No respiratory distress.     Breath sounds: Normal breath sounds. No wheezing, rhonchi or rales.  Musculoskeletal:     Cervical back: Neck supple.     Right lower leg: No edema.     Left lower leg: No edema.     Comments: Hypermobility of thumbs, knees, ankles, and hips  Lymphadenopathy:     Cervical: No cervical adenopathy.  Skin:    General: Skin is warm and dry.     Findings: No rash.   Neurological:     Mental Status: She is alert and oriented to person, place, and time. Mental status is at baseline.  Psychiatric:        Mood and Affect: Mood normal.        Behavior: Behavior normal.       EKG: NSR  No results found for any visits on 04/02/24.  Assessment & Plan     Problem List Items Addressed This Visit   None Visit Diagnoses       Paroxysmal tachycardia (HCC)    -  Primary   Relevant Orders   EKG 12-Lead   LONG TERM MONITOR (3-14 DAYS)   Comprehensive metabolic panel with GFR   CBC   TSH     Palpitations       Relevant Orders   EKG 12-Lead   LONG TERM MONITOR (3-14 DAYS)   Comprehensive metabolic panel with GFR   CBC   TSH           Paroxysmal tachycardia Intermittent tachycardia with positional changes, suggestive of POTS. Heart rate increases significantly upon standing. Symptoms include lightheadedness and elevated heart rate during sleep. Differential includes thyroid disease, anemia, and electrolyte abnormalities. Previous PVCs noted, but no recent 14-day heart monitor. Symptoms have been present for approximately five months. - Order EKG and 14-day Zio patch heart monitor - Draw labs to rule out thyroid disease, anemia, and electrolyte abnormalities - Increase water intake to 3 liters per day - Increase sodium intake to 4 grams per day - Consider use of compression socks and aerobic exercise as tolerated - EDS/hypermobility may contribute to constellation of symptoms.  Cervical spondylosis with radiculopathy Cervical spondylosis with radiculopathy diagnosed via MRI. Symptoms include severe shoulder pain, chest pain, and loss of sensation in the left hand. ESI injections have been used for management.  Urinary frequency Frequent urination every 30-45 minutes, significantly impacting daily life. Potential association with hypermobility syndrome or POTS. - Consider pelvic floor physical therapy  Constipation and diarrhea Alternating  constipation and diarrhea with episodes of severe pain and nocturnal symptoms. Managed with Metamucil and Senna, which reduce frequency of episodes.  Asthma Asthma diagnosed post-COVID-19 infection in 2023. No current exacerbation or acute symptoms discussed.  Critical B12 deficiency Previously identified critical B12 deficiency, managed with supplementation.  Hyperhidrosis Excessive sweating, particularly after showers.         No orders of the defined types were placed in this encounter.    Return if symptoms worsen or fail to improve.      Total time spent on today's visit was greater than 40 minutes, including both face-to-face time and nonface-to-face time personally spent on review of chart (labs and imaging), discussing labs and goals, discussing further work-up, treatment options, answering patient's questions,  and coordinating care.   Aden Agreste, MD  [facility] [phone] (phone) [phone] (fax)  [facility]

## 2024-04-03 LAB — CBC
Hematocrit: 46.6 % (ref 34.0–46.6)
Hemoglobin: 15.5 g/dL (ref 11.1–15.9)
MCH: 29.3 pg (ref 26.6–33.0)
MCHC: 33.3 g/dL (ref 31.5–35.7)
MCV: 88 fL (ref 79–97)
Platelets: 270 10*3/uL (ref 150–450)
RBC: 5.29 x10E6/uL — ABNORMAL HIGH (ref 3.77–5.28)
RDW: 12.1 % (ref 11.7–15.4)
WBC: 10.3 10*3/uL (ref 3.4–10.8)

## 2024-04-03 LAB — COMPREHENSIVE METABOLIC PANEL WITH GFR
ALT: 34 IU/L — ABNORMAL HIGH (ref 0–32)
AST: 28 IU/L (ref 0–40)
Albumin: 4.6 g/dL (ref 3.9–4.9)
Alkaline Phosphatase: 89 IU/L (ref 44–121)
BUN/Creatinine Ratio: 13 (ref 9–23)
BUN: 10 mg/dL (ref 6–20)
Bilirubin Total: 0.3 mg/dL (ref 0.0–1.2)
CO2: 22 mmol/L (ref 20–29)
Calcium: 10.5 mg/dL — ABNORMAL HIGH (ref 8.7–10.2)
Chloride: 103 mmol/L (ref 96–106)
Creatinine, Ser: 0.79 mg/dL (ref 0.57–1.00)
Globulin, Total: 2.6 g/dL (ref 1.5–4.5)
Glucose: 98 mg/dL (ref 70–99)
Potassium: 4.5 mmol/L (ref 3.5–5.2)
Sodium: 138 mmol/L (ref 134–144)
Total Protein: 7.2 g/dL (ref 6.0–8.5)
eGFR: 99 mL/min/{1.73_m2} (ref >59)

## 2024-04-03 LAB — TSH: TSH: 0.773 u[IU]/mL (ref 0.450–4.500)

## 2024-04-04 ENCOUNTER — Encounter: Payer: Self-pay | Admitting: Family Medicine

## 2024-04-04 ENCOUNTER — Other Ambulatory Visit: Payer: Self-pay

## 2024-04-28 DIAGNOSIS — I479 Paroxysmal tachycardia, unspecified: Secondary | ICD-10-CM

## 2024-04-28 DIAGNOSIS — R002 Palpitations: Secondary | ICD-10-CM | POA: Diagnosis not present

## 2024-04-29 ENCOUNTER — Ambulatory Visit: Payer: Self-pay | Admitting: Family Medicine

## 2024-05-03 ENCOUNTER — Telehealth: Payer: Self-pay

## 2024-05-03 DIAGNOSIS — E538 Deficiency of other specified B group vitamins: Secondary | ICD-10-CM

## 2024-05-03 NOTE — Telephone Encounter (Signed)
 I don't have a mychart message for this patient, it wasn't sent to me. However, we can start Metoprolol xl 25 mg daily #30 r2. F/u with pcp in 3 months.  PCP can decide about her B12. It was not part of our workup. Looks like order to recheck CAlcium is already in.

## 2024-05-03 NOTE — Telephone Encounter (Signed)
 Copied from CRM [id number]. Topic: Clinical - Medication Question >> May 03, 2024  9:33 AM El Gravely T wrote: Reason for CRM: Reason for CRM: Patient calling back checking status of previous mychart message sent to provider regarding staring new medication.  Please call patient back at [phone]

## 2024-05-07 ENCOUNTER — Telehealth: Payer: Self-pay

## 2024-05-07 ENCOUNTER — Other Ambulatory Visit: Payer: Self-pay | Admitting: Family Medicine

## 2024-05-07 DIAGNOSIS — E66811 Obesity, class 1: Secondary | ICD-10-CM

## 2024-05-07 MED ORDER — METOPROLOL SUCCINATE ER 25 MG PO TB24: 25.0000 mg | ORAL_TABLET | Freq: Every day | ORAL | 2 refills | Status: DC

## 2024-05-07 NOTE — Telephone Encounter (Signed)
 Prescription for Metoprolol per Dr. Geraldene Kleine "we can start Metoprolol xl 25 mg daily #30 r2. F/u with pcp in 3 months"  Sent in. Will call patient later on to schedule for a 3 month follow-up.

## 2024-05-07 NOTE — Telephone Encounter (Signed)
 Copied from CRM [id number]. Topic: Clinical - Medication Question >> May 03, 2024  2:31 PM Sophia H wrote: Reason for CRM: Pt following up on beta blocker script that was going to be sent in. Please advise   [facility] [id number] [place], [place] - [address]

## 2024-05-07 NOTE — Telephone Encounter (Signed)
 Spoke with patient and informed of medication being sent to pharmacy.

## 2024-05-08 NOTE — Telephone Encounter (Signed)
 See my chart message

## 2024-05-08 NOTE — Telephone Encounter (Signed)
 LVMTCB  Ok to advise if call is returned

## 2024-05-08 NOTE — Telephone Encounter (Signed)
 Have placed order for calcium, PTH and vitamin D

## 2024-05-09 ENCOUNTER — Ambulatory Visit: Payer: Self-pay | Admitting: Family Medicine

## 2024-05-09 LAB — COMPREHENSIVE METABOLIC PANEL WITH GFR
ALT: 29 IU/L (ref 0–32)
AST: 25 IU/L (ref 0–40)
Albumin: 4.5 g/dL (ref 3.9–4.9)
Alkaline Phosphatase: 92 IU/L (ref 44–121)
BUN/Creatinine Ratio: 17 (ref 9–23)
BUN: 14 mg/dL (ref 6–20)
Bilirubin Total: 0.3 mg/dL (ref 0.0–1.2)
CO2: 17 mmol/L — ABNORMAL LOW (ref 20–29)
Calcium: 9.9 mg/dL (ref 8.7–10.2)
Chloride: 105 mmol/L (ref 96–106)
Creatinine, Ser: 0.82 mg/dL (ref 0.57–1.00)
Globulin, Total: 2.4 g/dL (ref 1.5–4.5)
Glucose: 95 mg/dL (ref 70–99)
Potassium: 4.7 mmol/L (ref 3.5–5.2)
Sodium: 140 mmol/L (ref 134–144)
Total Protein: 6.9 g/dL (ref 6.0–8.5)
eGFR: 94 mL/min/{1.73_m2} (ref >59)

## 2024-05-09 NOTE — Telephone Encounter (Signed)
 Please see the message below.

## 2024-05-11 ENCOUNTER — Other Ambulatory Visit: Payer: Self-pay | Admitting: Family Medicine

## 2024-05-11 DIAGNOSIS — F429 Obsessive-compulsive disorder, unspecified: Secondary | ICD-10-CM

## 2024-05-11 DIAGNOSIS — F419 Anxiety disorder, unspecified: Secondary | ICD-10-CM

## 2024-05-15 ENCOUNTER — Ambulatory Visit: Payer: Self-pay | Admitting: Family Medicine

## 2024-05-15 LAB — PTH, INTACT AND CALCIUM
Calcium: 10 mg/dL (ref 8.7–10.2)
PTH: 29 pg/mL (ref 15–65)

## 2024-05-15 LAB — VITAMIN B12: Vitamin B-12: 726 pg/mL (ref 232–1245)

## 2024-05-16 ENCOUNTER — Ambulatory Visit: Admitting: Nurse Practitioner

## 2024-06-01 LAB — VITAMIN B12: Vitamin B-12: 755 pg/mL (ref 232–1245)

## 2024-06-01 LAB — SPECIMEN STATUS REPORT

## 2024-06-03 ENCOUNTER — Encounter: Payer: Self-pay | Admitting: Family Medicine

## 2024-06-10 ENCOUNTER — Encounter: Payer: Self-pay | Admitting: Family Medicine

## 2024-06-11 MED ORDER — METOPROLOL SUCCINATE ER 25 MG PO TB24: 25.0000 mg | ORAL_TABLET | Freq: Every day | ORAL | 1 refills | Status: DC

## 2024-06-11 NOTE — Telephone Encounter (Signed)
 Yes please send in #90 r1 to the [facility] pharmacy

## 2024-07-31 ENCOUNTER — Other Ambulatory Visit: Payer: Self-pay | Admitting: Family Medicine

## 2024-07-31 DIAGNOSIS — F419 Anxiety disorder, unspecified: Secondary | ICD-10-CM

## 2024-08-07 ENCOUNTER — Ambulatory Visit: Admitting: Family Medicine

## 2024-08-14 ENCOUNTER — Ambulatory Visit: Admitting: Nurse Practitioner

## 2024-09-02 ENCOUNTER — Other Ambulatory Visit: Payer: Self-pay | Admitting: Family Medicine

## 2024-09-02 DIAGNOSIS — E66811 Obesity, class 1: Secondary | ICD-10-CM

## 2024-09-17 ENCOUNTER — Encounter: Payer: Self-pay | Admitting: Family Medicine

## 2024-09-17 MED ORDER — OXYBUTYNIN CHLORIDE ER 5 MG PO TB24: 5.0000 mg | ORAL_TABLET | Freq: Every day | ORAL | 1 refills | Status: DC

## 2024-09-25 ENCOUNTER — Encounter: Payer: Self-pay | Admitting: Family Medicine

## 2024-09-25 ENCOUNTER — Ambulatory Visit: Admitting: Family Medicine

## 2024-09-25 VITALS — BP 130/87 | HR 83 | Ht 68.0 in | Wt 196.8 lb

## 2024-09-25 DIAGNOSIS — J4520 Mild intermittent asthma, uncomplicated: Secondary | ICD-10-CM | POA: Diagnosis not present

## 2024-09-25 DIAGNOSIS — I479 Paroxysmal tachycardia, unspecified: Secondary | ICD-10-CM

## 2024-09-25 DIAGNOSIS — F429 Obsessive-compulsive disorder, unspecified: Secondary | ICD-10-CM

## 2024-09-25 DIAGNOSIS — G4700 Insomnia, unspecified: Secondary | ICD-10-CM

## 2024-09-25 DIAGNOSIS — N3289 Other specified disorders of bladder: Secondary | ICD-10-CM

## 2024-09-25 NOTE — Patient Instructions (Signed)
 SABRA  Please review the attached list of medications and notify my office if there are any errors.   . Please bring all of your medications to every appointment so we can make sure that our medication list is the same as yours.

## 2024-10-06 ENCOUNTER — Other Ambulatory Visit: Payer: Self-pay | Admitting: Family Medicine

## 2024-10-06 DIAGNOSIS — Z3041 Encounter for surveillance of contraceptive pills: Secondary | ICD-10-CM

## 2024-10-06 DIAGNOSIS — E66811 Obesity, class 1: Secondary | ICD-10-CM

## 2024-10-07 ENCOUNTER — Other Ambulatory Visit: Payer: Self-pay

## 2024-10-07 ENCOUNTER — Telehealth: Payer: Self-pay | Admitting: Family Medicine

## 2024-10-07 DIAGNOSIS — I479 Paroxysmal tachycardia, unspecified: Secondary | ICD-10-CM | POA: Insufficient documentation

## 2024-10-07 DIAGNOSIS — N3289 Other specified disorders of bladder: Secondary | ICD-10-CM | POA: Insufficient documentation

## 2024-10-07 DIAGNOSIS — G4700 Insomnia, unspecified: Secondary | ICD-10-CM | POA: Insufficient documentation

## 2024-10-07 MED ORDER — OXYBUTYNIN CHLORIDE ER 5 MG PO TB24: 5.0000 mg | ORAL_TABLET | Freq: Every day | ORAL | 1 refills | Status: DC

## 2024-10-07 NOTE — Telephone Encounter (Signed)
 Converted into a refill request

## 2024-10-07 NOTE — Progress Notes (Signed)
 Established patient visit   Patient: Joy Garcia   DOB: [birth year]   38 y.o. Female  MRN: [id number] Visit Date: 09/25/2024  Today's healthcare provider: Nancyann Perry, MD   Chief Complaint  Patient presents with   Medical Management of Chronic Issues    Patient declined flu vaccine.   Subjective    HPI Patient presents for follow up asthma, OCD, and obesity. She is doing well with current medications. No currently taking maintenance inhaler and rarely requires albuterol  inhaler. Currently inhaler is expired and would like refill with fresh one. Mood and OCD symptoms under very good control on current dose of luvox  for many years. Is tolerating phentermine  well although weight loss has plateaued.   She was also recently prescribed oral oxybutynin for irritable bladder and reports it has worked very well. She has a little bit of dry mouth even before starting medication, and not much different now. She had previously been using OTC oxybutynin patches.   She had some issues with tachycardia and dizziness last spring associated with high blood pressure of 152/94 when she was in the office. She was started on metoprolol  by Dr. Myrla and reports no issues with heart racing since then, although her BP is sometimes low and occasionally gets light headed.   Medications: Outpatient Medications Prior to Visit  Medication Sig   albuterol  (VENTOLIN  HFA) 108 (90 Base) MCG/ACT inhaler Inhale 2 puffs into the lungs every 6 (six) hours as needed for wheezing or shortness of breath.   clonazePAM  (KLONOPIN ) 0.5 MG tablet TAKE ONE TABLET BY MOUTH AS NEEDED FOR ANXIETY   cyanocobalamin  1000 MCG tablet Take 1,000 mcg by mouth daily.   fluvoxaMINE  (LUVOX ) 100 MG tablet TAKE 1 AND 1/2 TABLETS     EVERY NIGHT   metoprolol  succinate (TOPROL -XL) 25 MG 24 hr tablet Take 1 tablet (25 mg total) by mouth daily. Take 1 tablet (25 mg total) by mouth daily.   naproxen  sodium (ANAPROX ) 550 MG tablet  Take 1 tablet (550 mg total) by mouth 2 (two) times daily with a meal.   oxybutynin (DITROPAN-XL) 5 MG 24 hr tablet Take 1 tablet (5 mg total) by mouth at bedtime.   traZODone  (DESYREL ) 50 MG tablet TAKE 1/2 TO 1 TABLET EVERY NIGHT AT BEDTIME AS NEEDED FOR SLEEP   [DISCONTINUED] Norethindrone  Acetate-Ethinyl Estradiol  (HAILEY 1.5/30) 1.5-30 MG-MCG tablet TAKE 1 TABLET BY MOUTH DAILY   [DISCONTINUED] phentermine  30 MG capsule TAKE 1 CAPSULE BY MOUTH EVERY MORNING   [DISCONTINUED] fluticasone  furoate-vilanterol (BREO ELLIPTA ) 100-25 MCG/ACT AEPB Inhale 1 puff into the lungs daily.   [DISCONTINUED] montelukast  (SINGULAIR ) 10 MG tablet TAKE 1 TABLET AT BEDTIME   No facility-administered medications prior to visit.    Review of Systems     Objective    BP 130/87 (BP Location: Left Arm, Patient Position: Sitting, Cuff Size: Normal)   Pulse 83   Ht 5' 8 (1.727 m)   Wt 196 lb 12.8 oz (89.3 kg)   SpO2 97%   BMI 29.92 kg/m    Physical Exam   General: Appearance:    Well developed, well nourished female in no acute distress  Eyes:    PERRL, conjunctiva/corneas clear, EOM's intact       Lungs:     Clear to auscultation bilaterally, respirations unlabored  Heart:    Normal heart rate. Normal rhythm. No murmurs, rubs, or gallops.    MS:   All extremities are intact.    Neurologic:  Awake, alert, oriented x 3. No apparent focal neurological defect.         Assessment & Plan     1. Obsessive-compulsive disorder, unspecified type (Primary) Very well controlled for years on current dose of Luvox . Continue current medications.    2. Paroxysmal tachycardia (HCC) Under good control on current dose of metoprolol , although has occasional episodes of dizziness.   3. Mild intermittent asthma without complication Much improved over the last year, no longer requiring maintenance inhaler and rarely requires albuterol .   4. Insomnia, unspecified type Fairly well controlled on current dose of  trazodone .    5 Irritable bladder Well controlled with recent transition from OTC oxybutynin transdermal patch to oral tablet.   Return in about 1 year (around 09/25/2025).         Nancyann Perry, MD  [facility] [phone] (phone) [phone] (fax)  [facility]

## 2024-10-07 NOTE — Telephone Encounter (Signed)
 [facility] faxed refill request for the following medications:   oxybutynin (DITROPAN-XL) 5 MG 24 hr tablet    Please advise.

## 2024-11-05 ENCOUNTER — Other Ambulatory Visit: Payer: Self-pay | Admitting: Family Medicine

## 2024-12-10 ENCOUNTER — Other Ambulatory Visit: Payer: Self-pay | Admitting: Family Medicine
